# Patient Record
Sex: Male | Born: 1965 | Race: White | Hispanic: No | Smoking: Never smoker
Health system: Southern US, Community
[De-identification: ages and names within clinical notes are randomized; demographics above are authoritative.]

## PROBLEM LIST (undated history)

## (undated) DIAGNOSIS — I10 Essential (primary) hypertension: Secondary | ICD-10-CM

## (undated) DIAGNOSIS — F329 Major depressive disorder, single episode, unspecified: Secondary | ICD-10-CM

## (undated) DIAGNOSIS — F32A Depression, unspecified: Secondary | ICD-10-CM

## (undated) DIAGNOSIS — Z87442 Personal history of urinary calculi: Secondary | ICD-10-CM

## (undated) DIAGNOSIS — F419 Anxiety disorder, unspecified: Secondary | ICD-10-CM

## (undated) DIAGNOSIS — M1990 Unspecified osteoarthritis, unspecified site: Secondary | ICD-10-CM

## (undated) DIAGNOSIS — N200 Calculus of kidney: Secondary | ICD-10-CM

## (undated) HISTORY — DX: Essential (primary) hypertension: I10

## (undated) HISTORY — PX: ANKLE SURGERY: SHX546

## (undated) HISTORY — PX: FRACTURE SURGERY: SHX138

## (undated) HISTORY — DX: Calculus of kidney: N20.0

## (undated) HISTORY — PX: KIDNEY STONE SURGERY: SHX686

---
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
## 1998-11-14 ENCOUNTER — Emergency Department (HOSPITAL_COMMUNITY): Admission: EM | Admit: 1998-11-14 | Discharge: 1998-11-14 | Payer: Self-pay | Admitting: Emergency Medicine

## 1998-11-14 ENCOUNTER — Encounter: Payer: Self-pay | Admitting: Emergency Medicine

## 1998-11-18 ENCOUNTER — Ambulatory Visit (HOSPITAL_COMMUNITY): Admission: RE | Admit: 1998-11-18 | Discharge: 1998-11-18 | Payer: Self-pay | Admitting: Urology

## 1998-11-18 ENCOUNTER — Encounter: Payer: Self-pay | Admitting: Urology

## 1998-11-19 ENCOUNTER — Encounter: Payer: Self-pay | Admitting: Urology

## 1998-11-19 ENCOUNTER — Ambulatory Visit (HOSPITAL_COMMUNITY): Admission: RE | Admit: 1998-11-19 | Discharge: 1998-11-19 | Payer: Self-pay | Admitting: Urology

## 1999-07-23 ENCOUNTER — Encounter: Payer: Self-pay | Admitting: *Deleted

## 1999-07-23 ENCOUNTER — Encounter: Admission: RE | Admit: 1999-07-23 | Discharge: 1999-07-23 | Payer: Self-pay | Admitting: *Deleted

## 2002-10-18 ENCOUNTER — Ambulatory Visit (HOSPITAL_BASED_OUTPATIENT_CLINIC_OR_DEPARTMENT_OTHER): Admission: RE | Admit: 2002-10-18 | Discharge: 2002-10-18 | Payer: Self-pay | Admitting: Urology

## 2002-10-21 ENCOUNTER — Ambulatory Visit (HOSPITAL_BASED_OUTPATIENT_CLINIC_OR_DEPARTMENT_OTHER): Admission: RE | Admit: 2002-10-21 | Discharge: 2002-10-21 | Payer: Self-pay | Admitting: Urology

## 2002-10-21 ENCOUNTER — Encounter: Payer: Self-pay | Admitting: Urology

## 2002-12-30 ENCOUNTER — Ambulatory Visit (HOSPITAL_BASED_OUTPATIENT_CLINIC_OR_DEPARTMENT_OTHER): Admission: RE | Admit: 2002-12-30 | Discharge: 2002-12-30 | Payer: Self-pay | Admitting: Urology

## 2002-12-30 ENCOUNTER — Encounter: Payer: Self-pay | Admitting: Urology

## 2003-04-15 ENCOUNTER — Other Ambulatory Visit: Admission: RE | Admit: 2003-04-15 | Discharge: 2003-04-15 | Payer: Self-pay | Admitting: Family Medicine

## 2010-12-18 ENCOUNTER — Other Ambulatory Visit: Payer: Self-pay | Admitting: Family Medicine

## 2010-12-20 NOTE — Telephone Encounter (Signed)
Patient never seen here. Cannot find that patient saw you at [facility] either.

## 2013-05-17 ENCOUNTER — Encounter: Payer: Self-pay | Admitting: Podiatrist

## 2013-05-17 ENCOUNTER — Ambulatory Visit (INDEPENDENT_AMBULATORY_CARE_PROVIDER_SITE_OTHER): Payer: BC Managed Care – PPO | Admitting: Podiatrist

## 2013-05-17 VITALS — BP 142/75 | HR 67 | Resp 18

## 2013-05-17 DIAGNOSIS — G5781 Other specified mononeuropathies of right lower limb: Secondary | ICD-10-CM

## 2013-05-17 DIAGNOSIS — G5782 Other specified mononeuropathies of left lower limb: Secondary | ICD-10-CM

## 2013-05-17 DIAGNOSIS — G5760 Lesion of plantar nerve, unspecified lower limb: Secondary | ICD-10-CM

## 2013-05-17 NOTE — Patient Instructions (Signed)
Your 2nd interspace was injected today.  See if it feels better this time than it did after the last injection.  Try the pads in your shoes to take pressure off of the forefoot.  You may discontinue use if it is uncomfortable

## 2013-05-17 NOTE — Progress Notes (Signed)
°  Subjective:    Patient ID: Michael Mccoy, male    DOB: 03/13/1966, 47 y.o.   MRN: [id number]  HPI Patient present for neuroma pain right greater than left.  Patient has been receiving alcohol sclerosing injections.  Review of Systems unchanged from initial visit     Objective:   Physical Exam  Neurovascular status unchanged.  Pain with palpable click at the 2nd interspace of the right greater than left foot.  We tried an injection at the 3rd interspace at the last visit and it offered minimal relief.  Patient feels the previous injections were more beneficial which were at the 2nd interspace.          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  Will return in 2 weeks for injection #4.  strappings with met pads also fabricated to see if this offers offloading and relief  Marlowe Aschoff, DPM

## 2013-05-17 NOTE — Progress Notes (Deleted)
°  Subjective:    Patient ID: Michael Mccoy, male    DOB: [birth year], 47 y.o.   MRN: [id number]  HPI    Review of Systems  Constitutional: Negative.   HENT: Negative.   Eyes: Negative.   Respiratory: Negative.   Cardiovascular: Negative.   Gastrointestinal: Negative.   Endocrine: Negative.   Genitourinary: Negative.   Musculoskeletal: Negative.   Skin: Negative.   Allergic/Immunologic: Negative.   Neurological: Negative.   Hematological: Negative.   Psychiatric/Behavioral: Negative.        Objective:   Physical Exam        Assessment & Plan:

## 2013-05-31 ENCOUNTER — Encounter: Payer: Self-pay | Admitting: Podiatrist

## 2013-05-31 ENCOUNTER — Ambulatory Visit (INDEPENDENT_AMBULATORY_CARE_PROVIDER_SITE_OTHER): Payer: BC Managed Care – PPO | Admitting: Podiatrist

## 2013-05-31 VITALS — BP 155/99 | HR 49 | Resp 16 | Ht 72.0 in | Wt 180.0 lb

## 2013-05-31 DIAGNOSIS — G5760 Lesion of plantar nerve, unspecified lower limb: Secondary | ICD-10-CM

## 2013-05-31 DIAGNOSIS — G5781 Other specified mononeuropathies of right lower limb: Secondary | ICD-10-CM

## 2013-05-31 DIAGNOSIS — G5782 Other specified mononeuropathies of left lower limb: Secondary | ICD-10-CM

## 2013-05-31 DIAGNOSIS — B351 Tinea unguium: Secondary | ICD-10-CM

## 2013-05-31 NOTE — Progress Notes (Signed)
HPI  Patient present for forefoot pain right greater than left. Patient has been receiving alcohol sclerosing injections in the 2nd interspace and relates the left foot is ok now but the right is continuing to give him problems.  He states the pain now is below the 3rd metatarsal head with direct pressure.  As well as diffusely sub 2,3,4 right.   Objective:   Physical Exam Neurovascular status unchanged. Pain in several locations of the right foot are noted per the patient-  Pain submet 3 right, pain submet 2 and 4 , 2nd interspace as well.  No pinpoint area of discomfort identified. Clinically.  Palpable click still noted 2nd interspace right however has remained painful despite injection therapy Assessment & Plan:   A/ neuroma 2nd interspace right versus capsulitis 2nd or 3rd or 4th mpj  P/ I recommended he get a 2nd opinion by Dr. Charlsie Merles as alcohol Injections were helping but now are not per the patient.   Michael Mccoy, DPM

## 2013-06-06 ENCOUNTER — Ambulatory Visit (INDEPENDENT_AMBULATORY_CARE_PROVIDER_SITE_OTHER): Payer: BC Managed Care – PPO | Admitting: Podiatry

## 2013-06-06 ENCOUNTER — Encounter: Payer: Self-pay | Admitting: Podiatry

## 2013-06-06 VITALS — BP 154/94 | HR 53 | Resp 16

## 2013-06-06 DIAGNOSIS — M779 Enthesopathy, unspecified: Secondary | ICD-10-CM

## 2013-06-06 DIAGNOSIS — G5760 Lesion of plantar nerve, unspecified lower limb: Secondary | ICD-10-CM

## 2013-06-06 MED ORDER — TRIAMCINOLONE ACETONIDE 10 MG/ML IJ SUSP
5.0000 mg | Freq: Once | INTRAMUSCULAR | Status: AC
  Administered 2013-06-06: 5 mg via INTRA_ARTICULAR

## 2013-06-06 NOTE — Progress Notes (Signed)
Subjective:     Patient ID: Michael Mccoy, male   DOB: 11-27-1965, 47 y.o.   MRN: [id number]  HPI patient presents stating my right foot is still hurting. Has had for alcohol injections in the second interspace without relief of the pain he experiences. States the pain is worse when he is on his foot standing or if he does a lot of walking   Review of Systems  All other systems reviewed and are negative.       Objective:   Physical Exam  Nursing note and vitals reviewed. Constitutional: He is oriented to person, place, and time.  Cardiovascular: Intact distal pulses.   Musculoskeletal: Normal range of motion.  Neurological: He is oriented to person, place, and time.  Skin: Skin is warm.   patient is found to have discomfort that is mostly located in the third metatarsophalangeal joint right with mild discomfort in the adjacent interspace     Assessment:     Probable capsulitis of the third MPJ versus neuroma symptomatology    Plan:     Explained to condition and reviewed his x-rays today I did a proximal nerve block I then aspirated the joint and was able to get out small amount of clear fluid followed by application into the joint up a half cc of Kenalog dexamethasone combination with thick padding applied plantarly reappoint to recheck in 2 week

## 2013-06-14 ENCOUNTER — Ambulatory Visit: Payer: Self-pay | Admitting: Podiatrist

## 2013-06-20 ENCOUNTER — Ambulatory Visit: Payer: BC Managed Care – PPO | Admitting: Podiatry

## 2013-08-08 DIAGNOSIS — R4589 Other symptoms and signs involving emotional state: Secondary | ICD-10-CM

## 2013-08-08 HISTORY — DX: Other symptoms and signs involving emotional state: R45.89

## 2013-12-20 ENCOUNTER — Emergency Department (HOSPITAL_BASED_OUTPATIENT_CLINIC_OR_DEPARTMENT_OTHER)
Admission: EM | Admit: 2013-12-20 | Discharge: 2013-12-20 | Disposition: A | Discharge status: Home / Self Care | Payer: BC Managed Care – PPO | Attending: Emergency Medicine | Admitting: Emergency Medicine

## 2013-12-20 ENCOUNTER — Emergency Department (HOSPITAL_BASED_OUTPATIENT_CLINIC_OR_DEPARTMENT_OTHER): Payer: BC Managed Care – PPO

## 2013-12-20 DIAGNOSIS — R55 Syncope and collapse: Secondary | ICD-10-CM

## 2013-12-20 DIAGNOSIS — R0789 Other chest pain: Secondary | ICD-10-CM | POA: Insufficient documentation

## 2013-12-20 DIAGNOSIS — R5383 Other fatigue: Secondary | ICD-10-CM

## 2013-12-20 DIAGNOSIS — R5381 Other malaise: Secondary | ICD-10-CM | POA: Insufficient documentation

## 2013-12-20 DIAGNOSIS — IMO0002 Reserved for concepts with insufficient information to code with codable children: Secondary | ICD-10-CM | POA: Insufficient documentation

## 2013-12-20 DIAGNOSIS — Z87442 Personal history of urinary calculi: Secondary | ICD-10-CM | POA: Insufficient documentation

## 2013-12-20 DIAGNOSIS — R059 Cough, unspecified: Secondary | ICD-10-CM | POA: Insufficient documentation

## 2013-12-20 DIAGNOSIS — I10 Essential (primary) hypertension: Secondary | ICD-10-CM | POA: Insufficient documentation

## 2013-12-20 DIAGNOSIS — R42 Dizziness and giddiness: Secondary | ICD-10-CM | POA: Insufficient documentation

## 2013-12-20 DIAGNOSIS — R05 Cough: Secondary | ICD-10-CM | POA: Insufficient documentation

## 2013-12-20 DIAGNOSIS — R51 Headache: Secondary | ICD-10-CM | POA: Insufficient documentation

## 2013-12-20 DIAGNOSIS — Z79899 Other long term (current) drug therapy: Secondary | ICD-10-CM | POA: Insufficient documentation

## 2013-12-20 LAB — BASIC METABOLIC PANEL
BUN: 18 mg/dL (ref 6–23)
CALCIUM: 9.6 mg/dL (ref 8.4–10.5)
CHLORIDE: 102 meq/L (ref 96–112)
CO2: 24 meq/L (ref 19–32)
CREATININE: 1 mg/dL (ref 0.50–1.35)
GFR calc non Af Amer: 87 mL/min — ABNORMAL LOW (ref >90)
Glucose, Bld: 92 mg/dL (ref 70–99)
Potassium: 3.5 mEq/L — ABNORMAL LOW (ref 3.7–5.3)
SODIUM: 141 meq/L (ref 137–147)

## 2013-12-20 LAB — CBC WITH DIFFERENTIAL/PLATELET
BASOS ABS: 0 10*3/uL (ref 0.0–0.1)
BASOS PCT: 0 % (ref 0–1)
Eosinophils Absolute: 0.1 10*3/uL (ref 0.0–0.7)
Eosinophils Relative: 1 % (ref 0–5)
HCT: 40 % (ref 39.0–52.0)
Hemoglobin: 14 g/dL (ref 13.0–17.0)
LYMPHS PCT: 24 % (ref 12–46)
Lymphs Abs: 1.8 10*3/uL (ref 0.7–4.0)
MCH: 31.5 pg (ref 26.0–34.0)
MCHC: 35 g/dL (ref 30.0–36.0)
MCV: 90.1 fL (ref 78.0–100.0)
MONO ABS: 0.6 10*3/uL (ref 0.1–1.0)
Monocytes Relative: 8 % (ref 3–12)
NEUTROS ABS: 5 10*3/uL (ref 1.7–7.7)
Neutrophils Relative %: 67 % (ref 43–77)
PLATELETS: 212 10*3/uL (ref 150–400)
RBC: 4.44 MIL/uL (ref 4.22–5.81)
RDW: 13.4 % (ref 11.5–15.5)
WBC: 7.4 10*3/uL (ref 4.0–10.5)

## 2013-12-20 LAB — URINALYSIS, ROUTINE W REFLEX MICROSCOPIC
BILIRUBIN URINE: NEGATIVE
Glucose, UA: NEGATIVE mg/dL
Hgb urine dipstick: NEGATIVE
KETONES UR: NEGATIVE mg/dL
Leukocytes, UA: NEGATIVE
Nitrite: NEGATIVE
PROTEIN: NEGATIVE mg/dL
Specific Gravity, Urine: 1.021 (ref 1.005–1.030)
UROBILINOGEN UA: 1 mg/dL (ref 0.0–1.0)
pH: 6 (ref 5.0–8.0)

## 2013-12-20 LAB — TROPONIN I: Troponin I: <0.3 ng/mL (ref <0.30)

## 2013-12-20 MED ORDER — SODIUM CHLORIDE 0.9 % IV BOLUS (SEPSIS)
1000.0000 mL | Freq: Once | INTRAVENOUS | Status: AC
  Administered 2013-12-20: 1000 mL via INTRAVENOUS

## 2013-12-20 NOTE — ED Provider Notes (Signed)
CSN: [id number]     Arrival date & time 12/20/13  1803 History  This chart was scribed for Michael Mccoy, * by Blanchard KelchNicole Curnes, ED Scribe. The patient was seen in room MH08/MH08. Patient's care was started at 6:33 PM.     Chief Complaint  Patient presents with  . Blurred Vision     Patient is a 48 y.o. male presenting with eye problem. The history is provided by the patient. No language interpreter was used.  Eye Problem Location:  Both Quality: blurred. Onset quality:  Sudden Duration:  1 day Timing:  Intermittent Chronicity:  New Associated symptoms: headaches   Associated symptoms: no nausea, no numbness and no vomiting     HPI Comments: Michael Mccoy is a 48 y.o. male who presents to the Emergency Department complaining of intermittent, sudden-onset blurred vision that began this morning. He states that he was at his work standing when he experienced the blurred vision with concurrent dizziness and a syncopal sensation that lasted a few seconds. He describes the blurriness as generalized without being able to see his hand in front of his face.  He states that he is unsure if he lost consciousness with that episode or was "just out of it." He reports that he has had two episodes today that also lasted a few seconds while he was standing. He denies that he had suddenly stood up prior to the episodes coming on. He denies vision changes currently. He reports recent chest tightness, an intermittent headache that he describes as mild with brief episodes of sharp, intense pain and increased anxiety/stress from work. His wife states he has been agitated for the past month and has had severe coughing episodes in the mornings. He denies association with the coughing episodes and the blurred vision. He denies numbness, tingling, focal weakness, fever, abdominal pain, nausea, vomiting, diarrhea, constipation, melena or appetite change. He has a past medical history of hypertension and  kidney stones.   Past Medical History  Diagnosis Date  . Hypertension   . Kidney stones    No past surgical history on file. No family history on file. History  Substance Use Topics  . Smoking status: Never Smoker   . Smokeless tobacco: Never Used  . Alcohol Use: Yes    Review of Systems  Constitutional: Negative for fever and appetite change.  Eyes: Positive for visual disturbance.  Respiratory: Positive for cough and chest tightness.   Gastrointestinal: Negative for nausea, vomiting, abdominal pain and diarrhea.  Neurological: Positive for dizziness and headaches. Negative for numbness.  Psychiatric/Behavioral: Positive for agitation. The patient is nervous/anxious.   All other systems reviewed and are negative.     Allergies  Review of patient's allergies indicates no known allergies.  Home Medications   Prior to Admission medications   Medication Sig Start Date End Date Taking? Authorizing Provider  losartan (COZAAR) 100 MG tablet  05/14/13   Historical Provider, MD  NIFEdipine (PROCARDIA XL/ADALAT-CC) 90 MG 24 hr tablet  05/07/13   Historical Provider, MD   Triage Vitals: BP 136/97  Pulse 60  Temp(Src) 97.9 F (36.6 C) (Oral)  Resp 18  Ht 6\' 1"  (1.854 m)  Wt 185 lb (83.915 kg)  BMI 24.41 kg/m2  SpO2 100%  Physical Exam  Nursing note and vitals reviewed. Constitutional: He is oriented to person, place, and time. He appears well-developed and well-nourished. No distress.  HENT:  Head: Normocephalic and atraumatic.  Mouth/Throat: Oropharynx is clear and moist.  Eyes:  Conjunctivae are normal. Pupils are equal, round, and reactive to light. No scleral icterus.  Neck: Neck supple.  Cardiovascular: Normal rate, regular rhythm, normal heart sounds and intact distal pulses.   No murmur heard. Pulmonary/Chest: Effort normal and breath sounds normal. No stridor. No respiratory distress. He has no wheezes. He has no rales.  Abdominal: Soft. He exhibits no  distension. There is no tenderness.  Musculoskeletal: Normal range of motion. He exhibits no edema.  Neurological: He is alert and oriented to person, place, and time. He has normal strength. No cranial nerve deficit or sensory deficit. Coordination and gait normal. GCS eye subscore is 4. GCS verbal subscore is 5. GCS motor subscore is 6.  Skin: Skin is warm and dry. No rash noted.  Psychiatric: He has a normal mood and affect. His behavior is normal.    ED Course  Procedures (including critical care time)  DIAGNOSTIC STUDIES: Oxygen Saturation is 100% on room air, normal by my interpretation.    COORDINATION OF CARE: 6:44 PM -Will order BMP, CBC, Troponin I, UA and chest x-ray. Patient verbalizes understanding and agrees with treatment plan.    Labs Review Labs Reviewed  BASIC METABOLIC PANEL - Abnormal; Notable for the following:    Potassium 3.5 (*)    GFR calc non Af Amer 87 (*)    All other components within normal limits  CBC WITH DIFFERENTIAL  URINALYSIS, ROUTINE W REFLEX MICROSCOPIC  TROPONIN I    Imaging Review Dg Chest 2 View  12/20/2013   CLINICAL DATA:  Near syncope and chest tightness  EXAM: CHEST  2 VIEW  COMPARISON:  None.  FINDINGS: Lungs are clear. Heart size and pulmonary vascularity are normal. No adenopathy. No bone lesions. No pneumothorax.  IMPRESSION: No edema or consolidation.   Electronically Signed   By: Bretta BangWilliam  Woodruff M.D.   On: 12/20/2013 19:44     EKG Interpretation   Date/Time:  Friday Dec 20 2013 18:16:23 EDT Ventricular Rate:  60 PR Interval:  150 QRS Duration: 90 QT Interval:  424 QTC Calculation: 424 R Axis:   52 Text Interpretation:  Normal sinus rhythm Normal ECG No significant change  was found Confirmed by [facility]OFFORD  MD, TREY ([id number]) on 12/22/2013 1:11:14 AM      MDM   Final diagnoses:  Near syncope    48 yo male with history sounds most consistent with near-syncope in the setting of stress and generalized fatigue. His ED  workup was reassuring. He felt better after IV fluids. Clinical picture inconsistent with ACS, PE, dissection, CVA. discharged with PCP followup and return precautions given.  I personally performed the services described in this documentation, which was scribed in my presence. The recorded information has been reviewed and is accurate.     Michael ChurnJohn David Margarine Grosshans III, MD 12/22/13 0111

## 2013-12-20 NOTE — ED Notes (Signed)
Pt reports chest tightness.  3 episodes of sudden onset HA, dizziness and feelings of passing out today.  Denies LOC.  Reports HA at this time.  Pt acting weak and lethargic.

## 2013-12-20 NOTE — Discharge Instructions (Signed)
Near-Syncope °Near-syncope (commonly known as near fainting) is sudden weakness, dizziness, or feeling like you might pass out. During an episode of near-syncope, you may also develop pale skin, have tunnel vision, or feel sick to your stomach (nauseous). Near-syncope may occur when getting up after sitting or while standing for a long time. It is caused by a sudden decrease in blood flow to the brain. This decrease can result from various causes or triggers, most of which are not serious. However, because near-syncope can sometimes be a sign of something serious, a medical evaluation is required. The specific cause is often not determined. °HOME CARE INSTRUCTIONS  °Monitor your condition for any changes. The following actions may help to alleviate any discomfort you are experiencing: °· Have someone stay with you until you feel stable. °· Lie down right away if you start feeling like you might faint. Breathe deeply and steadily. Wait until all the symptoms have passed. Most of these episodes last only a few minutes. You may feel tired for several hours.   °· Drink enough fluids to keep your urine clear or pale yellow.   °· If you are taking blood pressure or heart medicine, get up slowly when seated or lying down. Take several minutes to sit and then stand. This can reduce dizziness. °· Follow up with your health care provider as directed.  °SEEK IMMEDIATE MEDICAL CARE IF:  °· You have a severe headache.   °· You have unusual pain in the chest, abdomen, or back.   °· You are bleeding from the mouth or rectum, or you have black or tarry stool.   °· You have an irregular or very fast heartbeat.   °· You have repeated fainting or have seizure-like jerking during an episode.   °· You faint when sitting or lying down.   °· You have confusion.   °· You have difficulty walking.   °· You have severe weakness.   °· You have vision problems.   °MAKE SURE YOU:  °· Understand these instructions. °· Will watch your  condition. °· Will get help right away if you are not doing well or get worse. °Document Released: 07/25/2005 Document Revised: 03/27/2013 Document Reviewed: 12/28/2012 °ExitCare® Patient Information ©2014 [employer]. ° °

## 2013-12-20 NOTE — ED Notes (Signed)
MD at bedside discussing plan of care with patient

## 2015-12-10 DIAGNOSIS — J069 Acute upper respiratory infection, unspecified: Secondary | ICD-10-CM | POA: Diagnosis not present

## 2017-01-06 DIAGNOSIS — M25569 Pain in unspecified knee: Secondary | ICD-10-CM | POA: Diagnosis not present

## 2017-01-06 DIAGNOSIS — I10 Essential (primary) hypertension: Secondary | ICD-10-CM | POA: Diagnosis not present

## 2017-01-06 DIAGNOSIS — N529 Male erectile dysfunction, unspecified: Secondary | ICD-10-CM | POA: Diagnosis not present

## 2017-01-06 DIAGNOSIS — J329 Chronic sinusitis, unspecified: Secondary | ICD-10-CM | POA: Diagnosis not present

## 2017-05-24 DIAGNOSIS — J029 Acute pharyngitis, unspecified: Secondary | ICD-10-CM | POA: Diagnosis not present

## 2017-09-04 DIAGNOSIS — N341 Nonspecific urethritis: Secondary | ICD-10-CM | POA: Diagnosis not present

## 2017-09-04 DIAGNOSIS — R1084 Generalized abdominal pain: Secondary | ICD-10-CM | POA: Diagnosis not present

## 2018-02-23 DIAGNOSIS — E7800 Pure hypercholesterolemia, unspecified: Secondary | ICD-10-CM | POA: Diagnosis not present

## 2018-02-23 DIAGNOSIS — Z1159 Encounter for screening for other viral diseases: Secondary | ICD-10-CM | POA: Diagnosis not present

## 2018-02-23 DIAGNOSIS — Z0000 Encounter for general adult medical examination without abnormal findings: Secondary | ICD-10-CM | POA: Diagnosis not present

## 2018-02-23 DIAGNOSIS — I10 Essential (primary) hypertension: Secondary | ICD-10-CM | POA: Diagnosis not present

## 2018-02-23 DIAGNOSIS — Z125 Encounter for screening for malignant neoplasm of prostate: Secondary | ICD-10-CM | POA: Diagnosis not present

## 2018-02-27 DIAGNOSIS — M545 Low back pain: Secondary | ICD-10-CM | POA: Diagnosis not present

## 2018-02-27 DIAGNOSIS — G5761 Lesion of plantar nerve, right lower limb: Secondary | ICD-10-CM | POA: Diagnosis not present

## 2018-02-27 DIAGNOSIS — M5441 Lumbago with sciatica, right side: Secondary | ICD-10-CM | POA: Diagnosis not present

## 2018-03-20 DIAGNOSIS — G5761 Lesion of plantar nerve, right lower limb: Secondary | ICD-10-CM | POA: Diagnosis not present

## 2018-03-21 DIAGNOSIS — G5761 Lesion of plantar nerve, right lower limb: Secondary | ICD-10-CM | POA: Diagnosis not present

## 2018-03-26 DIAGNOSIS — E7800 Pure hypercholesterolemia, unspecified: Secondary | ICD-10-CM | POA: Diagnosis not present

## 2018-03-26 DIAGNOSIS — N529 Male erectile dysfunction, unspecified: Secondary | ICD-10-CM | POA: Diagnosis not present

## 2018-03-26 DIAGNOSIS — I10 Essential (primary) hypertension: Secondary | ICD-10-CM | POA: Diagnosis not present

## 2018-04-05 DIAGNOSIS — K5730 Diverticulosis of large intestine without perforation or abscess without bleeding: Secondary | ICD-10-CM | POA: Diagnosis not present

## 2018-04-05 DIAGNOSIS — Z1211 Encounter for screening for malignant neoplasm of colon: Secondary | ICD-10-CM | POA: Diagnosis not present

## 2018-06-03 DIAGNOSIS — S00512A Abrasion of oral cavity, initial encounter: Secondary | ICD-10-CM | POA: Diagnosis not present

## 2018-07-22 DIAGNOSIS — M25571 Pain in right ankle and joints of right foot: Secondary | ICD-10-CM | POA: Diagnosis not present

## 2018-08-08 HISTORY — PX: COLONOSCOPY: SHX174

## 2019-01-20 DIAGNOSIS — M549 Dorsalgia, unspecified: Secondary | ICD-10-CM | POA: Diagnosis not present

## 2019-01-20 DIAGNOSIS — R350 Frequency of micturition: Secondary | ICD-10-CM | POA: Diagnosis not present

## 2019-01-20 DIAGNOSIS — I10 Essential (primary) hypertension: Secondary | ICD-10-CM | POA: Diagnosis not present

## 2019-01-20 DIAGNOSIS — R109 Unspecified abdominal pain: Secondary | ICD-10-CM | POA: Diagnosis not present

## 2019-01-22 ENCOUNTER — Other Ambulatory Visit: Payer: Self-pay | Admitting: Urology

## 2019-01-22 DIAGNOSIS — R3915 Urgency of urination: Secondary | ICD-10-CM | POA: Diagnosis not present

## 2019-01-22 DIAGNOSIS — K5730 Diverticulosis of large intestine without perforation or abscess without bleeding: Secondary | ICD-10-CM | POA: Diagnosis not present

## 2019-01-22 DIAGNOSIS — Z125 Encounter for screening for malignant neoplasm of prostate: Secondary | ICD-10-CM | POA: Diagnosis not present

## 2019-01-22 DIAGNOSIS — N132 Hydronephrosis with renal and ureteral calculous obstruction: Secondary | ICD-10-CM | POA: Diagnosis not present

## 2019-01-22 DIAGNOSIS — I700 Atherosclerosis of aorta: Secondary | ICD-10-CM | POA: Diagnosis not present

## 2019-01-22 DIAGNOSIS — N202 Calculus of kidney with calculus of ureter: Secondary | ICD-10-CM | POA: Diagnosis not present

## 2019-01-22 DIAGNOSIS — N5201 Erectile dysfunction due to arterial insufficiency: Secondary | ICD-10-CM | POA: Diagnosis not present

## 2019-01-26 ENCOUNTER — Other Ambulatory Visit (HOSPITAL_COMMUNITY)
Admission: RE | Admit: 2019-01-26 | Discharge: 2019-01-26 | Disposition: A | Discharge status: Home / Self Care | Payer: BC Managed Care – PPO | Source: Ambulatory Visit | Attending: Urology | Admitting: Urology

## 2019-01-26 DIAGNOSIS — Z1159 Encounter for screening for other viral diseases: Secondary | ICD-10-CM | POA: Insufficient documentation

## 2019-01-27 LAB — SARS CORONAVIRUS 2 (TAT 6-24 HRS): SARS Coronavirus 2: NEGATIVE

## 2019-01-29 ENCOUNTER — Other Ambulatory Visit: Payer: Self-pay

## 2019-01-29 ENCOUNTER — Encounter (HOSPITAL_BASED_OUTPATIENT_CLINIC_OR_DEPARTMENT_OTHER): Payer: Self-pay

## 2019-01-29 MED ORDER — GENTAMICIN SULFATE 40 MG/ML IJ SOLN
5.0000 mg/kg | INTRAVENOUS | Status: AC
  Administered 2019-01-30: 420 mg via INTRAVENOUS
  Filled 2019-01-29 (×2): qty 10.5

## 2019-01-29 NOTE — Progress Notes (Signed)
SPOKE W/  Jorrell     SCREENING SYMPTOMS OF COVID 19:   COUGH--NO  RUNNY NOSE--- NO  SORE THROAT---NO  NASAL CONGESTION----NO  SNEEZING----NO  SHORTNESS OF BREATH---NO  DIFFICULTY BREATHING---NO  TEMP >100.0 -----NO  UNEXPLAINED BODY ACHES------NO  CHILLS -------- NO  HEADACHES ---------NO  LOSS OF SMELL/ TASTE --------NO    HAVE YOU OR ANY FAMILY MEMBER TRAVELLED PAST 14 DAYS OUT OF THE   COUNTY---NO STATE----NO COUNTRY----NO  HAVE YOU OR ANY FAMILY MEMBER BEEN EXPOSED TO ANYONE WITH COVID 19? NO    

## 2019-01-29 NOTE — Progress Notes (Signed)
Spoke with: Roselyn Reef NPO:  No food after midnight/Clear liquids until 7:00AM DOS Arrival time: 1100AM Labs: Istat 8, EKG (COVID 01/28/2019 in epic) AM medications:  Amlodipine Pre op orders: Yes Ride home: Everlean Patterson [phone]

## 2019-01-30 ENCOUNTER — Encounter (HOSPITAL_BASED_OUTPATIENT_CLINIC_OR_DEPARTMENT_OTHER)
Admission: RE | Disposition: A | Discharge status: Home / Self Care | Payer: Self-pay | Source: Home / Self Care | Attending: Urology

## 2019-01-30 ENCOUNTER — Encounter (HOSPITAL_BASED_OUTPATIENT_CLINIC_OR_DEPARTMENT_OTHER): Payer: Self-pay | Admitting: Emergency Medicine

## 2019-01-30 ENCOUNTER — Other Ambulatory Visit: Payer: Self-pay

## 2019-01-30 ENCOUNTER — Ambulatory Visit (HOSPITAL_BASED_OUTPATIENT_CLINIC_OR_DEPARTMENT_OTHER)
Admission: RE | Admit: 2019-01-30 | Discharge: 2019-01-30 | Disposition: A | Discharge status: Home / Self Care | Payer: BC Managed Care – PPO | Attending: Urology | Admitting: Urology

## 2019-01-30 ENCOUNTER — Ambulatory Visit (HOSPITAL_BASED_OUTPATIENT_CLINIC_OR_DEPARTMENT_OTHER): Payer: BC Managed Care – PPO | Admitting: Anesthesiology

## 2019-01-30 DIAGNOSIS — F419 Anxiety disorder, unspecified: Secondary | ICD-10-CM | POA: Insufficient documentation

## 2019-01-30 DIAGNOSIS — E785 Hyperlipidemia, unspecified: Secondary | ICD-10-CM | POA: Diagnosis not present

## 2019-01-30 DIAGNOSIS — M1990 Unspecified osteoarthritis, unspecified site: Secondary | ICD-10-CM | POA: Diagnosis not present

## 2019-01-30 DIAGNOSIS — F329 Major depressive disorder, single episode, unspecified: Secondary | ICD-10-CM | POA: Diagnosis not present

## 2019-01-30 DIAGNOSIS — Z87442 Personal history of urinary calculi: Secondary | ICD-10-CM | POA: Diagnosis not present

## 2019-01-30 DIAGNOSIS — N202 Calculus of kidney with calculus of ureter: Secondary | ICD-10-CM | POA: Insufficient documentation

## 2019-01-30 DIAGNOSIS — I10 Essential (primary) hypertension: Secondary | ICD-10-CM | POA: Diagnosis not present

## 2019-01-30 HISTORY — DX: Personal history of urinary calculi: Z87.442

## 2019-01-30 HISTORY — PX: HOLMIUM LASER APPLICATION: SHX5852

## 2019-01-30 HISTORY — DX: Anxiety disorder, unspecified: F41.9

## 2019-01-30 HISTORY — DX: Unspecified osteoarthritis, unspecified site: M19.90

## 2019-01-30 HISTORY — DX: Depression, unspecified: F32.A

## 2019-01-30 HISTORY — PX: CYSTOSCOPY WITH RETROGRADE PYELOGRAM, URETEROSCOPY AND STENT PLACEMENT: SHX5789

## 2019-01-30 LAB — POCT I-STAT, CHEM 8
BUN: 24 mg/dL — ABNORMAL HIGH (ref 6–20)
Calcium, Ion: 1.26 mmol/L (ref 1.15–1.40)
Chloride: 100 mmol/L (ref 98–111)
Creatinine, Ser: 1.2 mg/dL (ref 0.61–1.24)
Glucose, Bld: 99 mg/dL (ref 70–99)
HCT: 42 % (ref 39.0–52.0)
Hemoglobin: 14.3 g/dL (ref 13.0–17.0)
Potassium: 3.7 mmol/L (ref 3.5–5.1)
Sodium: 138 mmol/L (ref 135–145)
TCO2: 30 mmol/L (ref 22–32)

## 2019-01-30 SURGERY — CYSTOURETEROSCOPY, WITH RETROGRADE PYELOGRAM AND STENT INSERTION
Anesthesia: General | Site: Ureter | Laterality: Right

## 2019-01-30 MED ORDER — FENTANYL CITRATE (PF) 100 MCG/2ML IJ SOLN
INTRAMUSCULAR | Status: AC
  Filled 2019-01-30: qty 2

## 2019-01-30 MED ORDER — SODIUM CHLORIDE 0.9 % IR SOLN
Status: DC | PRN
  Administered 2019-01-30: 6000 mL via INTRAVESICAL

## 2019-01-30 MED ORDER — MIDAZOLAM HCL 2 MG/2ML IJ SOLN
INTRAMUSCULAR | Status: DC | PRN
  Administered 2019-01-30: 2 mg via INTRAVENOUS

## 2019-01-30 MED ORDER — FENTANYL CITRATE (PF) 100 MCG/2ML IJ SOLN
25.0000 ug | INTRAMUSCULAR | Status: DC | PRN
  Filled 2019-01-30: qty 1

## 2019-01-30 MED ORDER — KETOROLAC TROMETHAMINE 30 MG/ML IJ SOLN
INTRAMUSCULAR | Status: AC
  Filled 2019-01-30: qty 1

## 2019-01-30 MED ORDER — KETOROLAC TROMETHAMINE 10 MG PO TABS: 10.0000 mg | ORAL_TABLET | Freq: Four times a day (QID) | ORAL | 0 refills | Status: DC | PRN

## 2019-01-30 MED ORDER — DEXAMETHASONE SODIUM PHOSPHATE 10 MG/ML IJ SOLN
INTRAMUSCULAR | Status: AC
  Filled 2019-01-30: qty 1

## 2019-01-30 MED ORDER — CEPHALEXIN 500 MG PO CAPS: 500.0000 mg | ORAL_CAPSULE | Freq: Two times a day (BID) | ORAL | 0 refills | Status: DC

## 2019-01-30 MED ORDER — DEXAMETHASONE SODIUM PHOSPHATE 10 MG/ML IJ SOLN
INTRAMUSCULAR | Status: DC | PRN
  Administered 2019-01-30: 10 mg via INTRAVENOUS

## 2019-01-30 MED ORDER — GLYCOPYRROLATE PF 0.2 MG/ML IJ SOSY
PREFILLED_SYRINGE | INTRAMUSCULAR | Status: AC
  Filled 2019-01-30: qty 1

## 2019-01-30 MED ORDER — ONDANSETRON HCL 4 MG/2ML IJ SOLN
INTRAMUSCULAR | Status: AC
  Filled 2019-01-30: qty 2

## 2019-01-30 MED ORDER — PROPOFOL 10 MG/ML IV BOLUS
INTRAVENOUS | Status: DC | PRN
  Administered 2019-01-30: 200 mg via INTRAVENOUS

## 2019-01-30 MED ORDER — LACTATED RINGERS IV SOLN
INTRAVENOUS | Status: DC
  Administered 2019-01-30: 12:00:00 via INTRAVENOUS
  Filled 2019-01-30: qty 1000

## 2019-01-30 MED ORDER — GLYCOPYRROLATE PF 0.2 MG/ML IJ SOSY
PREFILLED_SYRINGE | INTRAMUSCULAR | Status: DC | PRN
  Administered 2019-01-30: .2 mg via INTRAVENOUS

## 2019-01-30 MED ORDER — MIDAZOLAM HCL 2 MG/2ML IJ SOLN
INTRAMUSCULAR | Status: AC
  Filled 2019-01-30: qty 2

## 2019-01-30 MED ORDER — LIDOCAINE 2% (20 MG/ML) 5 ML SYRINGE
INTRAMUSCULAR | Status: DC | PRN
  Administered 2019-01-30: 60 mg via INTRAVENOUS

## 2019-01-30 MED ORDER — LIDOCAINE 2% (20 MG/ML) 5 ML SYRINGE
INTRAMUSCULAR | Status: AC
  Filled 2019-01-30: qty 5

## 2019-01-30 MED ORDER — KETOROLAC TROMETHAMINE 30 MG/ML IJ SOLN
INTRAMUSCULAR | Status: DC | PRN
  Administered 2019-01-30: 30 mg via INTRAVENOUS

## 2019-01-30 MED ORDER — SENNOSIDES-DOCUSATE SODIUM 8.6-50 MG PO TABS: 1.0000 | ORAL_TABLET | Freq: Two times a day (BID) | ORAL | 0 refills | Status: DC

## 2019-01-30 MED ORDER — OXYCODONE-ACETAMINOPHEN 5-325 MG PO TABS: 1.0000 | ORAL_TABLET | Freq: Four times a day (QID) | ORAL | 0 refills | Status: AC | PRN

## 2019-01-30 MED ORDER — IOHEXOL 300 MG/ML  SOLN
INTRAMUSCULAR | Status: DC | PRN
  Administered 2019-01-30: 14:00:00 10 mL via URETHRAL

## 2019-01-30 MED ORDER — PROPOFOL 10 MG/ML IV BOLUS
INTRAVENOUS | Status: AC
  Filled 2019-01-30: qty 20

## 2019-01-30 MED ORDER — ONDANSETRON HCL 4 MG/2ML IJ SOLN
4.0000 mg | Freq: Once | INTRAMUSCULAR | Status: AC | PRN
  Administered 2019-01-30: 15:00:00 4 mg via INTRAVENOUS
  Filled 2019-01-30: qty 2

## 2019-01-30 MED ORDER — FENTANYL CITRATE (PF) 100 MCG/2ML IJ SOLN
INTRAMUSCULAR | Status: DC | PRN
  Administered 2019-01-30 (×2): 25 ug via INTRAVENOUS
  Administered 2019-01-30: 50 ug via INTRAVENOUS

## 2019-01-30 SURGICAL SUPPLY — 25 items
BAG DRAIN URO-CYSTO SKYTR STRL (DRAIN) ×2 IMPLANT
BAG DRN UROCATH (DRAIN) ×1
BASKET LASER NITINOL 1.9FR (BASKET) ×1 IMPLANT
BSKT STON RTRVL 120 1.9FR (BASKET) ×1
CATH INTERMIT  6FR 70CM (CATHETERS) ×1 IMPLANT
CLOTH BEACON ORANGE TIMEOUT ST (SAFETY) ×2 IMPLANT
FIBER LASER FLEXIVA 365 (UROLOGICAL SUPPLIES) IMPLANT
FIBER LASER TRAC TIP (UROLOGICAL SUPPLIES) ×1 IMPLANT
GLOVE BIO SURGEON STRL SZ7.5 (GLOVE) ×2 IMPLANT
GOWN STRL REUS W/TWL LRG LVL3 (GOWN DISPOSABLE) ×2 IMPLANT
GUIDEWIRE ANG ZIPWIRE 038X150 (WIRE) ×2 IMPLANT
GUIDEWIRE STR DUAL SENSOR (WIRE) ×2 IMPLANT
IV NS 1000ML (IV SOLUTION) ×2
IV NS 1000ML BAXH (IV SOLUTION) ×1 IMPLANT
IV NS IRRIG 3000ML ARTHROMATIC (IV SOLUTION) ×2 IMPLANT
KIT TURNOVER CYSTO (KITS) ×2 IMPLANT
MANIFOLD NEPTUNE II (INSTRUMENTS) ×2 IMPLANT
NS IRRIG 500ML POUR BTL (IV SOLUTION) ×4 IMPLANT
PACK CYSTO (CUSTOM PROCEDURE TRAY) ×2 IMPLANT
SHEATH URETERAL 12FRX35CM (MISCELLANEOUS) ×1 IMPLANT
STENT POLARIS 5FRX26 (STENTS) ×1 IMPLANT
SYR 10ML LL (SYRINGE) ×2 IMPLANT
TUBE CONNECTING 12X1/4 (SUCTIONS) ×1 IMPLANT
TUBE FEEDING 8FR 16IN STR KANG (MISCELLANEOUS) ×1 IMPLANT
TUBING UROLOGY SET (TUBING) ×1 IMPLANT

## 2019-01-30 NOTE — Brief Op Note (Signed)
01/30/2019  2:31 PM  PATIENT:  Michael Mccoy  53 y.o. male  PRE-OPERATIVE DIAGNOSIS:  RIGHT URETERAL / RENAL STONES  POST-OPERATIVE DIAGNOSIS:  RIGHT URETERAL / RENAL STONES  PROCEDURE:  Procedure(s) with comments: CYSTOSCOPY WITH RETROGRADE PYELOGRAM, URETEROSCOPY AND STENT PLACEMENT (Right) - 90 MINS HOLMIUM LASER APPLICATION (Right)  SURGEON:  Surgeon(s) and Role:    Alexis Frock, MD - Primary  PHYSICIAN ASSISTANT:   ASSISTANTS: none   ANESTHESIA:   general  EBL:  minimal   BLOOD ADMINISTERED:none  DRAINS: none   LOCAL MEDICATIONS USED:  NONE  SPECIMEN:  Source of Specimen:  Rt renal / ureteral stone fragments  DISPOSITION OF SPECIMEN:  [facility] for compositional analysis  COUNTS:  YES  TOURNIQUET:  * No tourniquets in log *  DICTATION: .Other Dictation: Dictation Number [id number]  PLAN OF CARE: Discharge to home after PACU  PATIENT DISPOSITION:  PACU - hemodynamically stable.   Delay start of Pharmacological VTE agent (>24hrs) due to surgical blood loss or risk of bleeding: yes

## 2019-01-30 NOTE — Discharge Instructions (Signed)
1 - You may have urinary urgency (bladder spasms) and bloody urine on / off with stent in place. This is normal.  2 - Remove tethered stent on Friday morning at home by pulling on string, then blue-white plastic tubing, and discarding. Office is open Friday if and problems arise.   3 -  Call MD or go to ER for fever >102, severe pain / nausea / vomiting not relieved by medications, or acute change in medical status   Post Anesthesia Home Care Instructions  Activity: Get plenty of rest for the remainder of the day. A responsible individual must stay with you for 24 hours following the procedure.  For the next 24 hours, DO NOT: -Drive a car -Paediatric nurse -Drink alcoholic beverages -Take any medication unless instructed by your physician -Make any legal decisions or sign important papers.  Meals: Start with liquid foods such as gelatin or soup. Progress to regular foods as tolerated. Avoid greasy, spicy, heavy foods. If nausea and/or vomiting occur, drink only clear liquids until the nausea and/or vomiting subsides. Call your physician if vomiting continues.  Special Instructions/Symptoms: Your throat may feel dry or sore from the anesthesia or the breathing tube placed in your throat during surgery. If this causes discomfort, gargle with warm salt water. The discomfort should disappear within 24 hours.

## 2019-01-30 NOTE — Anesthesia Preprocedure Evaluation (Signed)
Anesthesia Evaluation  Patient identified by MRN, date of birth, ID band Patient awake    Reviewed: Allergy & Precautions, NPO status , Patient's Chart, lab work & pertinent test results  Airway Mallampati: II  TM Distance: >3 FB Neck ROM: Full    Dental  (+) Dental Advisory Given   Pulmonary neg pulmonary ROS,    breath sounds clear to auscultation       Cardiovascular hypertension, Pt. on medications  Rhythm:Regular Rate:Normal     Neuro/Psych negative neurological ROS     GI/Hepatic negative GI ROS, Neg liver ROS,   Endo/Other  negative endocrine ROS  Renal/GU Renal InsufficiencyRenal disease     Musculoskeletal  (+) Arthritis ,   Abdominal   Peds  Hematology negative hematology ROS (+)   Anesthesia Other Findings   Reproductive/Obstetrics                             Anesthesia Physical Anesthesia Plan  ASA: II  Anesthesia Plan: General   Post-op Pain Management:    Induction: Intravenous  PONV Risk Score and Plan: 2 and Dexamethasone, Ondansetron and Treatment may vary due to age or medical condition  Airway Management Planned: LMA  Additional Equipment:   Intra-op Plan:   Post-operative Plan: Extubation in OR  Informed Consent: I have reviewed the patients History and Physical, chart, labs and discussed the procedure including the risks, benefits and alternatives for the proposed anesthesia with the patient or authorized representative who has indicated his/her understanding and acceptance.     Dental advisory given  Plan Discussed with: CRNA  Anesthesia Plan Comments:         Anesthesia Quick Evaluation

## 2019-01-30 NOTE — Transfer of Care (Signed)
Immediate Anesthesia Transfer of Care Note  Patient: Michael Mccoy  Procedure(s) Performed: Procedure(s) (LRB): CYSTOSCOPY WITH RETROGRADE PYELOGRAM, URETEROSCOPY AND STENT PLACEMENT (Right) HOLMIUM LASER APPLICATION (Right)  Patient Location: PACU  Anesthesia Type: General  Level of Consciousness: awake, oriented, sedated and patient cooperative  Airway & Oxygen Therapy: Patient Spontanous Breathing and Patient connected to face mask oxygen  Post-op Assessment: Report given to PACU RN and Post -op Vital signs reviewed and stable  Post vital signs: Reviewed and stable  Complications: No apparent anesthesia complications  Last Vitals:  Vitals Value Taken Time  BP    Temp    Pulse 78 01/30/19 1444  Resp 16 01/30/19 1444  SpO2 100 % 01/30/19 1444  Vitals shown include unvalidated device data.  Last Pain:  Vitals:   01/30/19 1053  TempSrc: Oral      Patients Stated Pain Goal: 3 (01/30/19 1053)

## 2019-01-30 NOTE — Progress Notes (Signed)
Dr. Jillyn Hidden aware of BP 189/101- 177/106 and pt admits not taking his meds the last two days. OK to go home and take meds.

## 2019-01-30 NOTE — H&P (Signed)
Urology Admission H&P  Chief Complaint: Pre op Right Ureteroscopic Stone Manipulation  History of Present Illness:   1 - Recurrent Urolithiasis -  Pre 2020 - SWLx2, URS x 1  01/2019 - CT Rt prox 2mm stone (SSSD 14cm, 1400HU) and RUP 8mm pap tip / left side stone free.    PMH sig for orthos urgery, HLD, HTN. NO ischemic CV disease / blood thinners.. His PCP is Michael Guthrie MD with [facility]. 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   Today " Michael Mccoy " is seen to proceed with RIGHT ureteroscopic stone manipulation. Most recent UA without infectious parameters. NO interval fevers.      Past Medical History:  Diagnosis Date  . Anxiety   . Arthritis   . Depression   . History of kidney stones   . Hypertension   . Suicidal behavior 2015   Past Surgical History:  Procedure Laterality Date  . ANKLE SURGERY Left   . COLONOSCOPY  08/2018  . KIDNEY STONE SURGERY     Several    Home Medications:  Current Facility-Administered Medications  Medication Dose Route Frequency Provider Last Rate Last Dose  . gentamicin (GARAMYCIN) 420 mg in dextrose 5 % 100 mL IVPB  5 mg/kg Intravenous 30 min Pre-Op Michael Frock, MD      . lactated ringers infusion   Intravenous Continuous Michael Soman, MD 50 mL/hr at 01/30/19 1134     Allergies: No Known Allergies  History reviewed. No pertinent family history. Social History:  reports that he has never smoked. He has never used smokeless tobacco. He reports current alcohol use. He reports current drug use. Drug: Marijuana.  ROS  Physical Exam:  Vital signs in last 24 hours: Temp:  [98 F (36.7 C)] 98 F (36.7 C) (06/24 1053) Pulse Rate:  [55] 55 (06/24 1053) Resp:  [16] 16 (06/24 1053) BP: (174)/(98) 174/98 (06/24 1053) SpO2:  [99 %] 99 % (06/24 1053) Weight:  [83.6 kg] 83.6 kg (06/24 1053) Physical Exam  Laboratory Data:  Results for orders placed or performed during the  hospital encounter of 01/30/19 (from the past 24 hour(s))  I-STAT, chem 8     Status: Abnormal   Collection Time: 01/30/19 11:16 AM  Result Value Ref Range   Sodium 138 135 - 145 mmol/L   Potassium 3.7 3.5 - 5.1 mmol/L   Chloride 100 98 - 111 mmol/L   BUN 24 (H) 6 - 20 mg/dL   Creatinine, Ser 1.20 0.61 - 1.24 mg/dL   Glucose, Bld 99 70 - 99 mg/dL   Calcium, Ion 1.26 1.15 - 1.40 mmol/L   TCO2 30 22 - 32 mmol/L   Hemoglobin 14.3 13.0 - 17.0 g/dL   HCT 42.0 39.0 - 52.0 %   Recent Results (from the past 240 hour(s))  SARS Coronavirus 2 (Performed in [place] hospital lab)     Status: None   Collection Time: 01/26/19  4:00 PM   Specimen: Nasal Swab  Result Value Ref Range Status   SARS Coronavirus 2 NEGATIVE NEGATIVE Final    Comment: (NOTE) SARS-CoV-2 target nucleic acids are NOT DETECTED. The SARS-CoV-2 RNA is generally detectable in upper and lower respiratory specimens during the acute phase of infection. Negative results do not preclude SARS-CoV-2 infection, do not rule out co-infections with other pathogens, and should not be used as the sole basis for treatment or other patient management decisions. Negative results must be combined with  clinical observations, patient history, and epidemiological information. The expected result is Negative. Fact Sheet for Patients: FlowerCheck.behttps://www.fda.gov/media/136695/download Fact Sheet for Healthcare Providers: https://www.smith-hall.com/https://www.fda.gov/media/136692/download This test is not yet approved or cleared by the [place] FDA and  has been authorized for detection and/or diagnosis of SARS-CoV-2 by FDA under an Emergency Use Authorization (EUA). This EUA will remain  in effect (meaning this test can be used) for the duration of the COVID-19 declaration under Section 56 4(b)(1) of the Act, 21 U.S.C. section 360bbb-3(b)(1), unless the authorization is terminated or revoked sooner. Performed at [facility], [address] [place],  [place] [id number]    Creatinine: Recent Labs    01/30/19 1116  CREATININE 1.20     Plan:   Proceed as planned with RIGHT Ureteroscopic Stone manipulation with goal of Rt side stone free. Risks, benefits, alternatives, expected peri-op course discussed previously and reiterated today.   Michael Mccoy 01/30/2019, 12:02 PM

## 2019-01-30 NOTE — Anesthesia Procedure Notes (Signed)
Procedure Name: LMA Insertion Date/Time: 01/30/2019 1:18 PM Performed by: Suan Halter, CRNA Pre-anesthesia Checklist: Patient identified, Emergency Drugs available, Suction available and Patient being monitored Patient Re-evaluated:Patient Re-evaluated prior to induction Oxygen Delivery Method: Circle system utilized Preoxygenation: Pre-oxygenation with 100% oxygen Induction Type: IV induction Ventilation: Mask ventilation without difficulty LMA: LMA inserted LMA Size: 4.0 Number of attempts: 1 Airway Equipment and Method: Bite block Placement Confirmation: positive ETCO2 Tube secured with: Tape Dental Injury: Teeth and Oropharynx as per pre-operative assessment

## 2019-01-31 ENCOUNTER — Encounter (HOSPITAL_BASED_OUTPATIENT_CLINIC_OR_DEPARTMENT_OTHER): Payer: Self-pay | Admitting: Urology

## 2019-01-31 DIAGNOSIS — N202 Calculus of kidney with calculus of ureter: Secondary | ICD-10-CM | POA: Diagnosis not present

## 2019-01-31 NOTE — Op Note (Signed)
NAME: Michael Mccoy, Michael Mccoy MEDICAL RECORD [id number] ACCOUNT [id number] DATE OF BIRTH:05-Oct-1965 FACILITY: [facility] LOCATION: [facility] PHYSICIAN:Leng Montesdeoca, MD  OPERATIVE REPORT  DATE OF PROCEDURE:  01/30/2019  PREOPERATIVE DIAGNOSIS:  Right ureteral and renal stones.  PROCEDURE: 1.  Cystoscopy, right retrograde pyelogram, interpretation. 2.  Right ureteroscopy with laser lithotripsy. 3.  Right ureteral stent 5 x 26 Polaris with tether.  SURGEON:  Alexis Frock, MD  ESTIMATED BLOOD LOSS:  Nil.  COMPLICATIONS:  None.  SPECIMENS:  Right renal and ureteral stones for analysis.  FINDINGS: 1.  Right proximal ureteral stone, right upper mid pole renal stone. 2.  Complete resolution of all accessible stone fragments larger than one-third mm following laser lithotripsy and basket extraction. 3.  Successful placement of right ureteral stent proximal in the renal pelvis, distal end urinary bladder.  INDICATIONS:  The patient is a pleasant 53 year old man who was found on workup for colicky flank pain to have a recurrent large right proximal ureteral stone at around 13 mm or so.  Office discussion of management including shock lithotripsy versus  uteroscopy versus medical therapy, and given the multifocality and size, it was agreed upon ureteroscopy with goal of right side stone free.  Informed consent was obtained and placed in medical record.  DESCRIPTION OF PROCEDURE:  The patient was identified.  The procedure being right ureteroscopic stent placement confirmed.  Procedure timeout was performed.  Intravenous antibiotics administered.  General LMA anesthesia induced.  The patient was placed  into a low lithotomy position, sterile field was created prepped and draped base of the penis and perineum and proximal thighs using iodine x3 and cystourethroscopy was performed with a 21-French rigid cystoscope with offset lens.  Inspection of anterior  and posterior urethra were unremarkable.   Inspection of the urinary bladder revealed no diverticula, calcifications, papillary lesions.  Ureteral orifices were singleton bilaterally.  The right ureteral orifice was cannulated with a 6-French renal  catheter and right retrograde pyelogram was obtained.  Right retrograde pyelogram demonstrated a single right ureter with single system right kidney.  There was a calcification turned filling defect in the proximal right ureter consistent with known stone.  A 0.038 ZIPwire was then advanced to the level to  the lower pole and set aside as a safety wire.  An 8-French feeding tube was placed in the urinary bladder pressure released and semirigid ureteroscopy was performed distal 4/5 of the right ureter alongside a separate sensor working wire.  No mucosal  abnormalities were found.  At the very upper reaches of the semirigid scope, the lower portion of the impacted stone was visualized.  This was not conducive to laser lithotripsy with the scope.  Therefore, it was exchanged for a 12/14 medium length  ureteral access sheath to the level of the proximal ureter using continuous fluoroscopic guidance, taking exquisite care not to pass the sheath proximal to the area of the visualized stone and flexible digital ureteroscopy was then performed of the  proximal right ureter.  This revealed a large somewhat impacted proximal stone as anticipated and holmium laser energy applied at a setting of 0.2 joules and 20 Hz and using a dusting technique, approximately 70% of the stone was ablated.  The remaining  25% was fragmented and fragments removed with an escape basket and set aside for analysis.  Repeat ureteroscopy of the whole right kidney, including all calices x3, did reveal an upper mid pole calix stone as anticipated.  It also appeared to be too  large for  simple basketing and holmium laser energy applied to the stone, fragmenting it into approximately 3 smaller pieces that were then removed.  Following this,  excellent hemostasis.  No evidence of renal perforation.  There was complete resolution  of all accessible stone fragments larger than one-third mm.  The access sheath was removed under continuous vision.  There is only mild to moderate mucosal edema at the site of prior stone impaction in the right ureter proximally.  It was felt that  interval stenting with tethered stent be warranted.  As such, a new 5 x 26 Polaris-type stent was placed remaining safety wire using fluoroscopic guidance.  Good proximal and distal plane were noted.  Tether was left in place and fashioned to the dorsum  of the penis and the procedure was terminated.  The patient tolerated the procedure well.  No immediate perioperative complications.  The patient did postanesthesia care in stable condition with plan for discharge home.  TN/NUANCE  D:01/30/2019 T:01/30/2019 JOB:[id number]

## 2019-01-31 NOTE — Anesthesia Postprocedure Evaluation (Signed)
Anesthesia Post Note  Patient: Michael Mccoy  Procedure(s) Performed: CYSTOSCOPY WITH RETROGRADE PYELOGRAM, URETEROSCOPY AND STENT PLACEMENT (Right Ureter) HOLMIUM LASER APPLICATION (Right Ureter)     Patient location during evaluation: PACU Anesthesia Type: General Level of consciousness: awake and alert Pain management: pain level controlled Vital Signs Assessment: post-procedure vital signs reviewed and stable Respiratory status: spontaneous breathing, nonlabored ventilation, respiratory function stable and patient connected to nasal cannula oxygen Cardiovascular status: blood pressure returned to baseline and stable Postop Assessment: no apparent nausea or vomiting Anesthetic complications: no    Last Vitals:  Vitals:   01/30/19 1515 01/30/19 1600  BP: (!) 169/99 (!) 187/91  Pulse: 62 (!) 52  Resp: 15 12  Temp:  37.1 C  SpO2: 98% 97%    Last Pain:  Vitals:   01/30/19 1600  TempSrc:   PainSc: 0-No pain                 Tiajuana Amass

## 2019-06-04 DIAGNOSIS — N3281 Overactive bladder: Secondary | ICD-10-CM | POA: Diagnosis not present

## 2019-06-04 DIAGNOSIS — Z125 Encounter for screening for malignant neoplasm of prostate: Secondary | ICD-10-CM | POA: Diagnosis not present

## 2019-06-04 DIAGNOSIS — Z0000 Encounter for general adult medical examination without abnormal findings: Secondary | ICD-10-CM | POA: Diagnosis not present

## 2019-06-04 DIAGNOSIS — E7800 Pure hypercholesterolemia, unspecified: Secondary | ICD-10-CM | POA: Diagnosis not present

## 2019-06-04 DIAGNOSIS — Z23 Encounter for immunization: Secondary | ICD-10-CM | POA: Diagnosis not present

## 2019-08-19 DIAGNOSIS — R110 Nausea: Secondary | ICD-10-CM | POA: Diagnosis not present

## 2019-08-19 DIAGNOSIS — Z20822 Contact with and (suspected) exposure to covid-19: Secondary | ICD-10-CM | POA: Diagnosis not present

## 2019-08-19 DIAGNOSIS — R509 Fever, unspecified: Secondary | ICD-10-CM | POA: Diagnosis not present

## 2020-05-25 DIAGNOSIS — S0181XA Laceration without foreign body of other part of head, initial encounter: Secondary | ICD-10-CM | POA: Diagnosis not present

## 2020-06-12 DIAGNOSIS — I10 Essential (primary) hypertension: Secondary | ICD-10-CM | POA: Diagnosis not present

## 2020-06-12 DIAGNOSIS — N200 Calculus of kidney: Secondary | ICD-10-CM | POA: Diagnosis not present

## 2020-06-12 DIAGNOSIS — Z0000 Encounter for general adult medical examination without abnormal findings: Secondary | ICD-10-CM | POA: Diagnosis not present

## 2020-06-12 DIAGNOSIS — Z23 Encounter for immunization: Secondary | ICD-10-CM | POA: Diagnosis not present

## 2020-06-12 DIAGNOSIS — Z125 Encounter for screening for malignant neoplasm of prostate: Secondary | ICD-10-CM | POA: Diagnosis not present

## 2020-06-12 DIAGNOSIS — E7800 Pure hypercholesterolemia, unspecified: Secondary | ICD-10-CM | POA: Diagnosis not present

## 2020-07-07 ENCOUNTER — Other Ambulatory Visit: Payer: Self-pay

## 2020-07-07 ENCOUNTER — Other Ambulatory Visit: Payer: BC Managed Care – PPO

## 2020-07-07 DIAGNOSIS — Z20822 Contact with and (suspected) exposure to covid-19: Secondary | ICD-10-CM

## 2020-07-09 LAB — NOVEL CORONAVIRUS, NAA: SARS-CoV-2, NAA: NOT DETECTED

## 2020-07-09 LAB — SPECIMEN STATUS REPORT

## 2020-07-09 LAB — SARS-COV-2, NAA 2 DAY TAT

## 2020-07-10 DIAGNOSIS — R062 Wheezing: Secondary | ICD-10-CM | POA: Diagnosis not present

## 2020-07-10 DIAGNOSIS — J029 Acute pharyngitis, unspecified: Secondary | ICD-10-CM | POA: Diagnosis not present

## 2020-07-10 DIAGNOSIS — I10 Essential (primary) hypertension: Secondary | ICD-10-CM | POA: Diagnosis not present

## 2020-08-27 DIAGNOSIS — M7711 Lateral epicondylitis, right elbow: Secondary | ICD-10-CM | POA: Diagnosis not present

## 2020-08-27 DIAGNOSIS — M67912 Unspecified disorder of synovium and tendon, left shoulder: Secondary | ICD-10-CM | POA: Diagnosis not present

## 2020-08-27 DIAGNOSIS — M25512 Pain in left shoulder: Secondary | ICD-10-CM | POA: Diagnosis not present

## 2020-08-27 DIAGNOSIS — M7702 Medial epicondylitis, left elbow: Secondary | ICD-10-CM | POA: Diagnosis not present

## 2020-09-09 DIAGNOSIS — Z03818 Encounter for observation for suspected exposure to other biological agents ruled out: Secondary | ICD-10-CM | POA: Diagnosis not present

## 2020-09-09 DIAGNOSIS — Z20822 Contact with and (suspected) exposure to covid-19: Secondary | ICD-10-CM | POA: Diagnosis not present

## 2021-03-10 DIAGNOSIS — Z20822 Contact with and (suspected) exposure to covid-19: Secondary | ICD-10-CM | POA: Diagnosis not present

## 2021-04-22 ENCOUNTER — Emergency Department (HOSPITAL_COMMUNITY)
Admission: EM | Admit: 2021-04-22 | Discharge: 2021-04-22 | Disposition: A | Discharge status: Home / Self Care | Payer: No Typology Code available for payment source | Attending: Emergency Medicine | Admitting: Emergency Medicine

## 2021-04-22 ENCOUNTER — Emergency Department (HOSPITAL_COMMUNITY): Payer: No Typology Code available for payment source

## 2021-04-22 ENCOUNTER — Other Ambulatory Visit: Payer: Self-pay

## 2021-04-22 DIAGNOSIS — I10 Essential (primary) hypertension: Secondary | ICD-10-CM | POA: Insufficient documentation

## 2021-04-22 DIAGNOSIS — Y990 Civilian activity done for income or pay: Secondary | ICD-10-CM | POA: Insufficient documentation

## 2021-04-22 DIAGNOSIS — S39012A Strain of muscle, fascia and tendon of lower back, initial encounter: Secondary | ICD-10-CM | POA: Insufficient documentation

## 2021-04-22 DIAGNOSIS — W19XXXA Unspecified fall, initial encounter: Secondary | ICD-10-CM | POA: Insufficient documentation

## 2021-04-22 DIAGNOSIS — Z79899 Other long term (current) drug therapy: Secondary | ICD-10-CM | POA: Insufficient documentation

## 2021-04-22 DIAGNOSIS — T63441A Toxic effect of venom of bees, accidental (unintentional), initial encounter: Secondary | ICD-10-CM | POA: Diagnosis not present

## 2021-04-22 DIAGNOSIS — M25531 Pain in right wrist: Secondary | ICD-10-CM | POA: Insufficient documentation

## 2021-04-22 DIAGNOSIS — S34109A Unspecified injury to unspecified level of lumbar spinal cord, initial encounter: Secondary | ICD-10-CM | POA: Diagnosis present

## 2021-04-22 MED ORDER — DIAZEPAM 2 MG PO TABS
2.0000 mg | ORAL_TABLET | Freq: Once | ORAL | Status: AC
  Administered 2021-04-22: 2 mg via ORAL
  Filled 2021-04-22: qty 1

## 2021-04-22 MED ORDER — CYCLOBENZAPRINE HCL 10 MG PO TABS: 10.0000 mg | ORAL_TABLET | Freq: Two times a day (BID) | ORAL | 0 refills | Status: DC | PRN

## 2021-04-22 MED ORDER — HYDROCODONE-ACETAMINOPHEN 5-325 MG PO TABS: 2.0000 | ORAL_TABLET | Freq: Four times a day (QID) | ORAL | 0 refills | Status: DC | PRN

## 2021-04-22 NOTE — ED Provider Notes (Signed)
[facility]-EMERGENCY DEPT Provider Note   CSN: [id number] Arrival date & time: 04/22/21  1250     History Chief Complaint  Patient presents with   Michael Mccoy is a 55 y.o. male who present today for evaluation of fall.  He was stung by two bees and while running he fell on   He took three aleve about an hour ago.  He does not take any blood thinners.  He denies any allergic reaction to bees.  He denies any itching or swelling.  He feels like he was stung once on his face and once on his upper back however does not know where.  He denies concern for retained stinger.  He states that he did not strike his head or pass out.  No pain in his head or in his neck.  He has left-sided lateral low back pain with feelings of spasms.  He also reports pain in the base of his right thumb.  He is right-hand dominant.  He denies any rashes or itching.  No shortness of breath.  No chest pain or syncope.  HPI     Past Medical History:  Diagnosis Date   Anxiety    Arthritis    Depression    History of kidney stones    Hypertension    Suicidal behavior 2015    There are no problems to display for this patient.   Past Surgical History:  Procedure Laterality Date   ANKLE SURGERY Left    COLONOSCOPY  08/2018   CYSTOSCOPY WITH RETROGRADE PYELOGRAM, URETEROSCOPY AND STENT PLACEMENT Right 01/30/2019   Procedure: CYSTOSCOPY WITH RETROGRADE PYELOGRAM, URETEROSCOPY AND STENT PLACEMENT;  Surgeon: Sebastian Ache, MD;  Location: [facility];  Service: Urology;  Laterality: Right;  90 MINS   HOLMIUM LASER APPLICATION Right 01/30/2019   Procedure: HOLMIUM LASER APPLICATION;  Surgeon: Sebastian Ache, MD;  Location: [facility];  Service: Urology;  Laterality: Right;   KIDNEY STONE SURGERY     Several       No family history on file.  Social History   Tobacco Use   Smoking status: Never   Smokeless tobacco: Never  Vaping Use    Vaping Use: Never used  Substance Use Topics   Alcohol use: Yes   Drug use: Yes    Types: Marijuana    Comment: occ. last time several weeks ago    Home Medications Prior to Admission medications   Medication Sig Start Date End Date Taking? Authorizing Provider  HYDROcodone-acetaminophen (NORCO/VICODIN) 5-325 MG tablet Take 2 tablets by mouth every 6 (six) hours as needed for severe pain. 04/22/21  Yes Cristina Gong, PA-C  amLODipine (NORVASC) 10 MG tablet Take 10 mg by mouth every morning.    [provider]  cephALEXin (KEFLEX) 500 MG capsule Take 1 capsule (500 mg total) by mouth 2 (two) times daily. X 3 days to prevent infection with tethered stent in place. 01/30/19   Sebastian Ache, MD  cyclobenzaprine (FLEXERIL) 10 MG tablet Take 1 tablet (10 mg total) by mouth 2 (two) times daily as needed for muscle spasms. 04/22/21   Cristina Gong, PA-C  ketorolac (TORADOL) 10 MG tablet Take 1 tablet (10 mg total) by mouth every 6 (six) hours as needed for moderate pain. Or stent discomfort post-operatively 01/30/19   Sebastian Ache, MD  losartan (COZAAR) 100 MG tablet every morning.  05/14/13   [provider]  senna-docusate (SENOKOT-S) 8.6-50  MG tablet Take 1 tablet by mouth 2 (two) times daily. While taking strongest pain meds to prevent constipation. 01/30/19   Sebastian Ache, MD    Allergies    Patient has no known allergies.  Review of Systems   Review of Systems  Constitutional:  Negative for chills and fever.  Respiratory:  Negative for chest tightness and shortness of breath.   Gastrointestinal:  Negative for abdominal pain, nausea and vomiting.  Musculoskeletal:  Positive for back pain. Negative for neck pain.       Right wrist pain  Skin:  Negative for rash.  Neurological:  Negative for weakness and headaches.  All other systems reviewed and are negative.  Physical Exam Updated Vital Signs BP (!) 182/104 (BP Location: Right Arm)   Pulse (!) 55    Temp 98.2 F (36.8 C) (Oral)   Resp 16   SpO2 99%   Physical Exam Vitals and nursing note reviewed.  Constitutional:      Appearance: He is not ill-appearing.     Comments: Appears uncomfortable  HENT:     Head: Normocephalic and atraumatic.  Cardiovascular:     Rate and Rhythm: Normal rate.     Pulses: Normal pulses.     Comments: Bilateral arms and legs are warm and well perfused Pulmonary:     Effort: Pulmonary effort is normal. No respiratory distress.  Abdominal:     Tenderness: There is no abdominal tenderness. There is no guarding.  Musculoskeletal:     Cervical back: No rigidity.     Comments: Patient is mild upper midline L-spine tenderness to palpation without obvious step-offs or deformities. There is tenderness over the left lumbar back.  There is intermittent spasm type pain which makes patient appears very uncomfortable.  No weakness legs bilaterally.   There is tenderness over the base of the right thumb and pain with movement.  No tenderness over the anatomic snuffbox or in the right wrist.  Skin:    Findings: No rash.     Comments: Unable to identify site of sting  Neurological:     Mental Status: He is alert. Mental status is at baseline.     Comments: Awake and alert, answers all questions appropriately.  Speech is not slurred.    Psychiatric:        Mood and Affect: Mood normal.    ED Results / Procedures / Treatments   Labs (all labs ordered are listed, but only abnormal results are displayed) Labs Reviewed - No data to display  EKG None  Radiology DG Lumbar Spine Complete  Result Date: 04/22/2021 CLINICAL DATA:  Larey Seat at work EXAM: LUMBAR SPINE - COMPLETE 4+ VIEW COMPARISON:  01/20/2019 FINDINGS: Frontal, bilateral oblique, lateral views of the lumbar spine are obtained. There are 5 non-rib-bearing lumbar type vertebral bodies in anatomic alignment. No acute displaced fracture. Multilevel lumbar spondylosis and facet hypertrophy again noted and  unchanged. Sacroiliac joints are unremarkable. IMPRESSION: 1. Stable multilevel lumbar spondylosis and facet hypertrophy. No acute fracture. Electronically Signed   By: Sharlet Salina M.D.   On: 04/22/2021 15:37   DG Hand Complete Right  Result Date: 04/22/2021 CLINICAL DATA:  Larey Seat, pain EXAM: RIGHT HAND - COMPLETE 3+ VIEW COMPARISON:  None. FINDINGS: Frontal, oblique, and lateral views of the right hand are obtained. No fracture, subluxation, or dislocation. Joint space narrowing and osteophyte formation of the second distal interphalangeal joint consistent with osteoarthritis. Soft tissues are unremarkable. IMPRESSION: 1. No acute displaced fracture. Electronically Signed  By: Sharlet Salina M.D.   On: 04/22/2021 15:38    Procedures Procedures   Medications Ordered in ED Medications  diazepam (VALIUM) tablet 2 mg (2 mg Oral Given 04/22/21 1407)    ED Course  I have reviewed the triage vital signs and the nursing notes.  Pertinent labs & imaging results that were available during my care of the patient were reviewed by me and considered in my medical decision making (see chart for details).    MDM Rules/Calculators/A&P                          Patient is a 55 year old man who presents today for evaluation after a fall.  He was attacked by bees at work and 1 running away fell.  He reports pain in his right wrist and in his lower back.    He denies any allergic symptoms.  On exam he appears to be uncomfortable with back spasms.  He denies any red flag symptoms including no changes to bowel/bladder function.  No weakness.  He is able to ambulate however states it is painful to do so.  Given the degree of his pain he was treated in the emergency room with p.o. Valium which took the edge off of his pain and spasms. He does not have any abdominal pain, no history of AAA or similar.  Given that this started after a fall and is easily reproducible with any movement and he does not have pain  unless he is moving suspect musculoskeletal pain. Plain films of L-spine were obtained without fracture.  Given that his pain is primarily over the lateral aspect of the back I do not think he would require CT scan of the L-spine or further imaging. Right hand x-rays were obtained due to pain at the base of the thumb without fracture or other acute abnormality.  Recommended conservative care.  He did not strike his head or pass out, no pain in his head or neck, no indication for imaging of head or neck.  He is neurovascularly intact on exam.  Will give prescription for Flexeril for muscle spasms.  [facility] PMP is consulted and he is additionally given a prescription for short course of Vicodin.  We discussed safely using this, not to take both the Vicodin and Flexeril at the same time, and he states his understanding.  Work note is given.  While in the emergency room he was hypertensive.  I suspect that this is secondary to his pain.  Recommended PCP follow up for re-check.   Return precautions were discussed with patient who states their understanding.  At the time of discharge patient denied any unaddressed complaints or concerns.  Patient is agreeable for discharge home.  Note: Portions of this report may have been transcribed using voice recognition software. Every effort was made to ensure accuracy; however, inadvertent computerized transcription errors may be present   Final Clinical Impression(s) / ED Diagnoses Final diagnoses:  Strain of lumbar region, initial encounter  Fall, initial encounter  Right wrist pain  Bee sting, accidental or unintentional, initial encounter    Rx / DC Orders ED Discharge Orders          Ordered    cyclobenzaprine (FLEXERIL) 10 MG tablet  2 times daily PRN,   Status:  Discontinued        04/22/21 1619    cyclobenzaprine (FLEXERIL) 10 MG tablet  2 times daily PRN  04/22/21 1623    HYDROcodone-acetaminophen (NORCO/VICODIN) 5-325 MG  tablet  Every 6 hours PRN        04/22/21 1623             Norman Clay 04/22/21 2225    Lorre Nick, MD 04/24/21 1150

## 2021-04-22 NOTE — Discharge Instructions (Addendum)
Do not take both the norco and flexeril at the same time.  You should take one or the other.  The norco (hydrocodone-acetaminophen) has Tylenol in it and you need to consider this when you are taking the maximum safe dose of medicines.  Today you received medications that may make you sleepy or impair your ability to make decisions.  For the next 24 hours please do not drive, operate heavy machinery, care for a small child with out another adult present, or perform any activities that may cause harm to you or someone else if you were to fall asleep or be impaired.   You are being prescribed a medication which may make you sleepy. Please follow up of listed precautions for at least 24 hours after taking one dose.  Please take Ibuprofen (Advil, motrin) and Tylenol (acetaminophen) to relieve your pain.    You may take up to 600 MG (3 pills) of normal strength ibuprofen every 8 hours as needed.   You make take tylenol, up to 1,000 mg (two extra strength pills) every 8 hours as needed.   It is safe to take ibuprofen and tylenol at the same time as they work differently.   Do not take more than 3,000 mg tylenol in a 24 hour period (not more than one dose every 8 hours.  Please check all medication labels as many medications such as pain and cold medications may contain tylenol.  Do not drink alcohol while taking these medications.  Do not take other NSAID'S while taking ibuprofen (such as aleve or naproxen).  Please take ibuprofen with food to decrease stomach upset.  As we discussed I would recommend that you schedule a follow-up appointment with your primary care doctor.  While in the emergency room your blood pressure was elevated.  This is most likely due to pain and the stress of being in the emergency room.  Please follow-up with your primary care doctor to get this rechecked as if it remains elevated you will most likely need treatment for your high blood pressure or you may require changes in your  treatment.  If you develop any fevers, numbness in your legs or genitals, you have any changes to your ability to urinate or defecate, you have abdominal pain, or any new weakness or concerning symptoms please seek additional medical care and evaluation.

## 2021-04-22 NOTE — ED Triage Notes (Signed)
Pt presents with c/o fall that occurred at work today. 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 Pt c/o back pain and right wrist pain. Pt did not hit his head and experienced no LOC.

## 2021-06-22 DIAGNOSIS — Z23 Encounter for immunization: Secondary | ICD-10-CM | POA: Diagnosis not present

## 2021-06-22 DIAGNOSIS — Z0000 Encounter for general adult medical examination without abnormal findings: Secondary | ICD-10-CM | POA: Diagnosis not present

## 2021-06-22 DIAGNOSIS — N529 Male erectile dysfunction, unspecified: Secondary | ICD-10-CM | POA: Diagnosis not present

## 2021-06-22 DIAGNOSIS — Z125 Encounter for screening for malignant neoplasm of prostate: Secondary | ICD-10-CM | POA: Diagnosis not present

## 2021-06-22 DIAGNOSIS — I10 Essential (primary) hypertension: Secondary | ICD-10-CM | POA: Diagnosis not present

## 2021-06-24 ENCOUNTER — Other Ambulatory Visit: Payer: Self-pay | Admitting: Family Medicine

## 2021-06-24 DIAGNOSIS — I10 Essential (primary) hypertension: Secondary | ICD-10-CM

## 2021-09-07 ENCOUNTER — Encounter (HOSPITAL_BASED_OUTPATIENT_CLINIC_OR_DEPARTMENT_OTHER): Payer: Self-pay | Admitting: Cardiovascular Disease

## 2021-09-07 ENCOUNTER — Other Ambulatory Visit: Payer: Self-pay

## 2021-09-07 ENCOUNTER — Ambulatory Visit (HOSPITAL_BASED_OUTPATIENT_CLINIC_OR_DEPARTMENT_OTHER): Payer: BC Managed Care – PPO | Admitting: Cardiovascular Disease

## 2021-09-07 DIAGNOSIS — E7800 Pure hypercholesterolemia, unspecified: Secondary | ICD-10-CM

## 2021-09-07 DIAGNOSIS — R0789 Other chest pain: Secondary | ICD-10-CM | POA: Diagnosis not present

## 2021-09-07 DIAGNOSIS — I10 Essential (primary) hypertension: Secondary | ICD-10-CM

## 2021-09-07 DIAGNOSIS — Z006 Encounter for examination for normal comparison and control in clinical research program: Secondary | ICD-10-CM

## 2021-09-07 DIAGNOSIS — I1A0 Resistant hypertension: Secondary | ICD-10-CM

## 2021-09-07 HISTORY — DX: Essential (primary) hypertension: I10

## 2021-09-07 HISTORY — DX: Resistant hypertension: I1A.0

## 2021-09-07 HISTORY — DX: Other chest pain: R07.89

## 2021-09-07 HISTORY — DX: Pure hypercholesterolemia, unspecified: E78.00

## 2021-09-07 MED ORDER — HYDRALAZINE HCL 50 MG PO TABS: 50.0000 mg | ORAL_TABLET | Freq: Three times a day (TID) | ORAL | 3 refills | Status: DC

## 2021-09-07 MED ORDER — VALSARTAN 320 MG PO TABS: 320.0000 mg | ORAL_TABLET | Freq: Every day | ORAL | 3 refills | Status: DC

## 2021-09-07 NOTE — Assessment & Plan Note (Addendum)
Blood pressures are poorly controlled.  He has previously been uncontrolled when he was also on hydrochlorothiazide but did not tolerate this due to dehydration.  Cost is a concern as we consider different medication options.  We will switch losartan to valsartan 3 and 20 mg daily.  Continue amlodipine.  We will also add hydralazine 50 mg 3 times a day.  Avoid beta-blockers due to bradycardia.  Would consider adding doxazosin if his blood pressures remain poorly controlled given that he also has prostate issues and nocturia.  He will check his blood pressures twice a day and bring to follow-up.  He will hold the valsartan for 2 days and then get renin and and aldosterone levels checked.  We will check lipids and a CMP at that time as well.  He does struggle with stress and anxiety, which are likely contributing.  He gets good exercise at work and walks over 7 miles a day.  He is already started making out of good lifestyle changes.  He is interested in enrolling in our remote patient monitoring study and consents to monitoring in the [employer] remote patient monitoring system.

## 2021-09-07 NOTE — Assessment & Plan Note (Signed)
Lipids have been elevated in the past.  He will come back for fasting lipids and a CMP.

## 2021-09-07 NOTE — Research (Signed)
Subject Name: Michael Mccoy met inclusion and exclusion criteria for the Virtual Care and Social Determinant Interventions for the management of hypertension trial.  The informed consent form, study requirements and expectations were reviewed with the subject by Dr. Oval Linsey and myself. The subject was given the opportunity to read the consent and ask questions. The subject verbalized understanding of the trial requirements.  All questions were addressed prior to the signing of the consent form. The subject agreed to participate in the trial and signed the informed consent. The informed consent was obtained prior to performance of any protocol-specific procedures for the subject.  A copy of the signed informed consent was given to the subject and a copy was placed in the subject's medical record.  Michael Mccoy was randomized to Group 1.

## 2021-09-07 NOTE — Progress Notes (Signed)
[facility]pertension [facility] Initial Assessment:    Date:  09/07/2021   ID:  Michael Mccoy, DOB [birth year], MRN [id number]  PCP:  Ileana Ladd, MD  Cardiologist:  None  Nephrologist:  Referring MD: Ileana Ladd, MD   CC: Hypertension  History of Present Illness:    Michael Mccoy is a 56 y.o. male with a hx of hypertension, arthritis, nephrolithiasis, depression, and suicidal behavior, here to establish care in the Advanced [facility]pertension Clinic. He was in the ER 04/2021 for a fall. His blood pressure was 182/104. He was on amlodipine and losartan, and he was referred to [facility]. He saw Dr. Modesto Charon on 06/2021 and his BP was 163/92. He was referred for renal artery dopplers that have not been completed.  Today, he is doing well. He initially realized he had high blood pressure in 1991. At that time his BP had spiked to the 200s systolic and he passed out and fell onto the floor. At home he has a few BP cuffs, and he usually has similar readings to today's of 167/95. He notes it has always been this high even with other medications. Previously while he was taking beta blockers, his heart rate was found to be in the 40s when he donated blood. Occasionally he feels "a little" chest tightness, but he believes this may be due to his anxiety. He denies any chest tightness during exertion. While working he often is walking up to 7 miles a day (15-20 minute increments throughout the day). For work he is typically outside in the heat with limited areas to use the restroom. Therefore he notes he is prone to dehydration, which is why HCTZ was discontinued. He also reports he is prone to developing kidney stones. Previously he was drinking at least 30 oz cup of coffee daily. Since then he has cut this back to about 1.5 tablespoons of coffee grounds. He has sodas rarely, and alcohol consumption is limited to 1 beer or other drink per day. He has never been a smoker. He states his  diet is "horrible." He wants to start focus on limiting his carbohydrates. During the day he may have a few sandwiches, snack chips, or granola nut bars. Lately he is trying to be more active and reduce his stress, but this is difficult. He is not sure if he snores, and he generally feels well rested when he wakes up. After returning home and sitting on the couch he will fall asleep easily. He denies any palpitations, or shortness of breath. No lightheadedness, headaches, syncope, orthopnea, PND, lower extremity edema or exertional symptoms.  Previous antihypertensives: HCTZ -prone to dehydration working outside   Past Medical History:  Diagnosis Date   Anxiety    Arthritis    Chest pressure 09/07/2021   Depression    History of kidney stones    Hypertension    Pure hypercholesterolemia 09/07/2021   Resistant hypertension 09/07/2021   Suicidal behavior 2015    Past Surgical History:  Procedure Laterality Date   ANKLE SURGERY Left    COLONOSCOPY  08/2018   CYSTOSCOPY WITH RETROGRADE PYELOGRAM, URETEROSCOPY AND STENT PLACEMENT Right 01/30/2019   Procedure: CYSTOSCOPY WITH RETROGRADE PYELOGRAM, URETEROSCOPY AND STENT PLACEMENT;  Surgeon: Sebastian Ache, MD;  Location: [facility];  Service: Urology;  Laterality: Right;  90 MINS   HOLMIUM LASER APPLICATION Right 01/30/2019   Procedure: HOLMIUM LASER APPLICATION;  Surgeon: Sebastian Ache, MD;  Location: [facility];  Service: Urology;  Laterality: Right;   KIDNEY STONE SURGERY     Several    Current Medications: Current Meds  Medication Sig   amLODipine (NORVASC) 10 MG tablet Take 10 mg by mouth every morning.   hydrALAZINE (APRESOLINE) 50 MG tablet Take 1 tablet (50 mg total) by mouth 3 (three) times daily.   sildenafil (VIAGRA) 100 MG tablet Take 1 tablet by mouth as needed.   valsartan (DIOVAN) 320 MG tablet Take 1 tablet (320 mg total) by mouth daily.   [DISCONTINUED] losartan (COZAAR) 100 MG tablet  every morning.      Allergies:   Patient has no known allergies.   Social History   Socioeconomic History   Marital status: Divorced    Spouse name: Not on file   Number of children: Not on file   Years of education: Not on file   Highest education level: Not on file  Occupational History   Not on file  Tobacco Use   Smoking status: Never   Smokeless tobacco: Never  Vaping Use   Vaping Use: Never used  Substance and Sexual Activity   Alcohol use: Yes   Drug use: Yes    Types: Marijuana    Comment: occ. last time several weeks ago   Sexual activity: Not on file  Other Topics Concern   Not on file  Social History Narrative   Not on file   Social Determinants of Health   Financial Resource Strain: Low Risk    Difficulty of Paying Living Expenses: Not hard at all  Food Insecurity: No Food Insecurity   Worried About Programme researcher, broadcasting/film/videounning Out of Food in the Last Year: Never true   Ran Out of Food in the Last Year: Never true  Transportation Needs: No Transportation Needs   Lack of Transportation (Medical): No   Lack of Transportation (Non-Medical): No  Physical Activity: Unknown   Days of Exercise per Week: Not on file   Minutes of Exercise per Session: 0 min  Stress: Not on file  Social Connections: Not on file     Family History: The patient's family history includes Bradycardia in his father and mother; COPD in his father; Heart attack in his father.  ROS:   Please see the history of present illness.    (+) Chest tightness (+) Anxiety (+) Stress All other systems reviewed and are negative.  EKGs/Labs/Other Studies Reviewed:    No prior cardiovascular studies available.   EKG:   09/07/2021: EKG was not ordered.   Recent Labs: No results found for requested labs within last 8760 hours.   Recent Lipid Panel No results found for: CHOL, TRIG, HDL, CHOLHDL, VLDL, LDLCALC, LDLDIRECT  Physical Exam:    VS:  BP (!) 167/95 (BP Location: Right Arm, Patient Position:  Sitting, Cuff Size: Large)    Pulse 62    Ht 6' (1.829 m)    Wt 201 lb 9.6 oz (91.4 kg)    BMI 27.34 kg/m  , BMI Body mass index is 27.34 kg/m. GENERAL:  Well appearing HEENT: Pupils equal round and reactive, fundi not visualized, oral mucosa unremarkable NECK:  No jugular venous distention, waveform within normal limits, carotid upstroke brisk and symmetric, no bruits, no thyromegaly LUNGS:  Clear to auscultation bilaterally HEART:  RRR.  PMI not displaced or sustained,S1 and S2 within normal limits, no S3, no S4, no clicks, no rubs, no murmurs ABD:  Flat, positive bowel sounds normal in frequency in pitch, no bruits, no rebound, no guarding, no midline pulsatile  mass, no hepatomegaly, no splenomegaly EXT:  2 plus pulses throughout, no edema, no cyanosis no clubbing SKIN:  No rashes no nodules NEURO:  Cranial nerves II through XII grossly intact, motor grossly intact throughout PSYCH:  Cognitively intact, oriented to person place and time   ASSESSMENT/PLAN:    Resistant hypertension Blood pressures are poorly controlled.  He has previously been uncontrolled when he was also on hydrochlorothiazide but did not tolerate this due to dehydration.  Cost is a concern as we consider different medication options.  We will switch losartan to valsartan 3 and 20 mg daily.  Continue amlodipine.  We will also add hydralazine 50 mg 3 times a day.  Avoid beta-blockers due to bradycardia.  Would consider adding doxazosin if his blood pressures remain poorly controlled given that he also has prostate issues and nocturia.  He will check his blood pressures twice a day and bring to follow-up.  He will hold the valsartan for 2 days and then get renin and and aldosterone levels checked.  We will check lipids and a CMP at that time as well.  He does struggle with stress and anxiety, which are likely contributing.  He gets good exercise at work and walks over 7 miles a day.  He is already started making out of good  lifestyle changes.  He is interested in enrolling in our remote patient monitoring study and consents to monitoring in the [employer] remote patient monitoring system.  Pure hypercholesterolemia Lipids have been elevated in the past.  He will come back for fasting lipids and a CMP.  Chest pressure He reports chest pressure that occurs in the setting of stress and anxiety.  He never has exertional chest pain and is able to do a physical job without symptoms.  No ischemic evaluation at this time.   Screening for Secondary Hypertension:  Causes 09/07/2021  Drugs/Herbals Screened     - Comments limiting caffeine, no tobacco.  struggles with diet.  Renovascular HTN Not Screened     - Comments Renal artery Dopplers were appropriately ordered but he is unable to afford them at this time.  Sleep Apnea Screened     - Comments no sympotms  Thyroid Disease Screened     - Comments 1.1 on 06/2021  Hyperaldosteronism Screened     - Comments Check renal and and aldosterone  Pheochromocytoma Screened     - Comments No symptoms  Cushing's Syndrome Screened     - Comments No symptoms  Hyperparathyroidism Screened  Coarctation of the Aorta Screened     - Comments BP symmetric  Compliance Screened    Relevant Labs/Studies: Basic Labs Latest Ref Rng & Units 01/30/2019 12/20/2013  Sodium 135 - 145 mmol/L 138 141  Potassium 3.5 - 5.1 mmol/L 3.7 3.5(L)  Creatinine 0.61 - 1.24 mg/dL 1.611.20 0.961.00     Disposition:    FU with APP/PharmD in 1 month.   FU with Takeyla Million C. Duke Salviaandolph, MD, [facility] in 4 months.   Medication Adjustments/Labs and Tests Ordered: Current medicines are reviewed at length with the patient today.  Concerns regarding medicines are outlined above.   Orders Placed This Encounter  Procedures   Lipid panel   Aldosterone + renin activity w/ ratio   Comprehensive metabolic panel   Meds ordered this encounter  Medications   valsartan (DIOVAN) 320 MG tablet    Sig: Take 1 tablet (320 mg total)  by mouth daily.    Dispense:  90 tablet    Refill:  3   hydrALAZINE (APRESOLINE) 50 MG tablet    Sig: Take 1 tablet (50 mg total) by mouth 3 (three) times daily.    Dispense:  270 tablet    Refill:  3   I,Mathew Stumpf,acting as a scribe for [name], MD.,have documented all relevant documentation on the behalf of [name], MD,as directed by  [name], MD while in the presence of [name], MD.  I, Gabriel Conry C. Duke Salvia, MD have reviewed all documentation for this visit.  The documentation of the exam, diagnosis, procedures, and orders on 09/07/2021 are all accurate and complete.   Signed, [name], MD  09/07/2021 9:41 AM    [facility]

## 2021-09-07 NOTE — Patient Instructions (Signed)
Medication Instructions:  START HYDRALAZINE 50 MG THREE TIMES A DAY   START VALSARTAN 320 MG DAILY   STOP LOSARTAN    Labwork: HOLD YOUR VALSARTAN FOR 2 DAYS PRIOR TO LABS GO FASTING TO ANY [facility] OR UPSTAIRS [address]    Testing/Procedures: NONE   Follow-Up: 10/07/2021 AT 9:00 AM [facility]    You will receive a phone call from the PREP exercise and nutrition program to schedule an initial assessment.   Special Instructions:   MONITOR AND LOG BLOOD PRESSURE WITH MACHINE PROVIDED BRING YOUR READINGS TO FOLLOW UP IN 1 MONTH  DASH Eating Plan DASH stands for "Dietary Approaches to Stop Hypertension." The DASH eating plan is a healthy eating plan that has been shown to reduce high blood pressure (hypertension). It may also reduce your risk for type 2 diabetes, heart disease, and stroke. The DASH eating plan may also help with weight loss. What are tips for following this plan?  General guidelines Avoid eating more than 2,300 mg (milligrams) of salt (sodium) a day. If you have hypertension, you may need to reduce your sodium intake to 1,500 mg a day. Limit alcohol intake to no more than 1 drink a day for nonpregnant women and 2 drinks a day for men. One drink equals 12 oz of beer, 5 oz of wine, or 1 oz of hard liquor. Work with your health care provider to maintain a healthy body weight or to lose weight. Ask what an ideal weight is for you. Get at least 30 minutes of exercise that causes your heart to beat faster (aerobic exercise) most days of the week. Activities may include walking, swimming, or biking. Work with your health care provider or diet and nutrition specialist (dietitian) to adjust your eating plan to your individual calorie needs. Reading food labels  Check food labels for the amount of sodium per serving. Choose foods with less than 5 percent of the Daily Value of sodium. Generally, foods with less than 300 mg of sodium per serving fit into this  eating plan. To find whole grains, look for the word "whole" as the first word in the ingredient list. Shopping Buy products labeled as "low-sodium" or "no salt added." Buy fresh foods. Avoid canned foods and premade or frozen meals. Cooking Avoid adding salt when cooking. Use salt-free seasonings or herbs instead of table salt or sea salt. Check with your health care provider or pharmacist before using salt substitutes. Do not fry foods. Cook foods using healthy methods such as baking, boiling, grilling, and broiling instead. Cook with heart-healthy oils, such as olive, canola, soybean, or sunflower oil. Meal planning Eat a balanced diet that includes: 5 or more servings of fruits and vegetables each day. At each meal, try to fill half of your plate with fruits and vegetables. Up to 6-8 servings of whole grains each day. Less than 6 oz of lean meat, poultry, or fish each day. A 3-oz serving of meat is about the same size as a deck of cards. One egg equals 1 oz. 2 servings of low-fat dairy each day. A serving of nuts, seeds, or beans 5 times each week. Heart-healthy fats. Healthy fats called Omega-3 fatty acids are found in foods such as flaxseeds and coldwater fish, like sardines, salmon, and mackerel. Limit how much you eat of the following: Canned or prepackaged foods. Food that is high in trans fat, such as fried foods. Food that is high in saturated fat, such as fatty  meat. Sweets, desserts, sugary drinks, and other foods with added sugar. Full-fat dairy products. Do not salt foods before eating. Try to eat at least 2 vegetarian meals each week. Eat more home-cooked food and less restaurant, buffet, and fast food. When eating at a restaurant, ask that your food be prepared with less salt or no salt, if possible. What foods are recommended? The items listed may not be a complete list. Talk with your dietitian about what dietary choices are best for you. Grains Whole-grain or  whole-wheat bread. Whole-grain or whole-wheat pasta. Brown rice. Orpah Cobb. Bulgur. Whole-grain and low-sodium cereals. Pita bread. Low-fat, low-sodium crackers. Whole-wheat flour tortillas. Vegetables Fresh or frozen vegetables (raw, steamed, roasted, or grilled). Low-sodium or reduced-sodium tomato and vegetable juice. Low-sodium or reduced-sodium tomato sauce and tomato paste. Low-sodium or reduced-sodium canned vegetables. Fruits All fresh, dried, or frozen fruit. Canned fruit in natural juice (without added sugar). Meat and other protein foods Skinless chicken or [place]. Ground chicken or [place]. Pork with fat trimmed off. Fish and seafood. Egg whites. Dried beans, peas, or lentils. Unsalted nuts, nut butters, and seeds. Unsalted canned beans. Lean cuts of beef with fat trimmed off. Low-sodium, lean deli meat. Dairy Low-fat (1%) or fat-free (skim) milk. Fat-free, low-fat, or reduced-fat cheeses. Nonfat, low-sodium ricotta or cottage cheese. Low-fat or nonfat yogurt. Low-fat, low-sodium cheese. Fats and oils Soft margarine without trans fats. Vegetable oil. Low-fat, reduced-fat, or light mayonnaise and salad dressings (reduced-sodium). Canola, safflower, olive, soybean, and sunflower oils. Avocado. Seasoning and other foods Herbs. Spices. Seasoning mixes without salt. Unsalted popcorn and pretzels. Fat-free sweets. What foods are not recommended? The items listed may not be a complete list. Talk with your dietitian about what dietary choices are best for you. Grains Baked goods made with fat, such as croissants, muffins, or some breads. Dry pasta or rice meal packs. Vegetables Creamed or fried vegetables. Vegetables in a cheese sauce. Regular canned vegetables (not low-sodium or reduced-sodium). Regular canned tomato sauce and paste (not low-sodium or reduced-sodium). Regular tomato and vegetable juice (not low-sodium or reduced-sodium). Rosita Fire. Olives. Fruits Canned fruit in a light  or heavy syrup. Fried fruit. Fruit in cream or butter sauce. Meat and other protein foods Fatty cuts of meat. Ribs. Fried meat. Tomasa Blase. Sausage. Bologna and other processed lunch meats. Salami. Fatback. Hotdogs. Bratwurst. Salted nuts and seeds. Canned beans with added salt. Canned or smoked fish. Whole eggs or egg yolks. Chicken or [place] with skin. Dairy Whole or 2% milk, cream, and half-and-half. Whole or full-fat cream cheese. Whole-fat or sweetened yogurt. Full-fat cheese. Nondairy creamers. Whipped toppings. Processed cheese and cheese spreads. Fats and oils Butter. Stick margarine. Lard. Shortening. Ghee. Bacon fat. Tropical oils, such as coconut, palm kernel, or palm oil. Seasoning and other foods Salted popcorn and pretzels. Onion salt, garlic salt, seasoned salt, table salt, and sea salt. Worcestershire sauce. Tartar sauce. Barbecue sauce. Teriyaki sauce. Soy sauce, including reduced-sodium. Steak sauce. Canned and packaged gravies. Fish sauce. Oyster sauce. Cocktail sauce. Horseradish that you find on the shelf. Ketchup. Mustard. Meat flavorings and tenderizers. Bouillon cubes. Hot sauce and Tabasco sauce. Premade or packaged marinades. Premade or packaged taco seasonings. Relishes. Regular salad dressings. Where to find more information: National Heart, Lung, and Blood Institute: PopSteam.is American Heart Association: www.heart.org Summary The DASH eating plan is a healthy eating plan that has been shown to reduce high blood pressure (hypertension). It may also reduce your risk for type 2 diabetes, heart disease, and stroke. With the DASH  eating plan, you should limit salt (sodium) intake to 2,300 mg a day. If you have hypertension, you may need to reduce your sodium intake to 1,500 mg a day. When on the DASH eating plan, aim to eat more fresh fruits and vegetables, whole grains, lean proteins, low-fat dairy, and heart-healthy fats. Work with your health care provider or diet and  nutrition specialist (dietitian) to adjust your eating plan to your individual calorie needs. This information is not intended to replace advice given to you by your health care provider. Make sure you discuss any questions you have with your health care provider. Document Released: 07/14/2011 Document Revised: 07/07/2017 Document Reviewed: 07/18/2016 Elsevier Patient Education  2020 [employer].

## 2021-09-07 NOTE — Addendum Note (Signed)
Addended by: [name] C on: 09/07/2021 09:45 AM   Modules accepted: Orders

## 2021-09-07 NOTE — Assessment & Plan Note (Signed)
He reports chest pressure that occurs in the setting of stress and anxiety.  He never has exertional chest pain and is able to do a physical job without symptoms.  No ischemic evaluation at this time.

## 2021-09-11 ENCOUNTER — Telehealth: Payer: Self-pay | Admitting: Pharmacist

## 2021-09-11 NOTE — Telephone Encounter (Signed)
Spoke with patient.  Concerned that BP has not started to come down yet. Has been checking BP frequently and is typically in 160s-170s/100s-110s.  No adverse effects to new medications valsartan or hydralaZine. Checks BP at 6am and 6pm.  Advised that new medications may have not started working yet.  Since he was concerned, advised he can take another hydralazine 50mg  if systolic BP remains over 160

## 2021-09-13 NOTE — Telephone Encounter (Signed)
noted 

## 2021-10-07 ENCOUNTER — Ambulatory Visit: Payer: BC Managed Care – PPO | Admitting: Pharmacist

## 2021-10-07 ENCOUNTER — Encounter: Payer: Self-pay | Admitting: Pharmacist

## 2021-10-07 ENCOUNTER — Other Ambulatory Visit: Payer: Self-pay

## 2021-10-07 VITALS — BP 153/87 | HR 68 | Resp 15 | Ht 72.0 in | Wt 199.0 lb

## 2021-10-07 DIAGNOSIS — E7800 Pure hypercholesterolemia, unspecified: Secondary | ICD-10-CM | POA: Diagnosis not present

## 2021-10-07 DIAGNOSIS — I10 Essential (primary) hypertension: Secondary | ICD-10-CM | POA: Diagnosis not present

## 2021-10-07 DIAGNOSIS — I1A0 Resistant hypertension: Secondary | ICD-10-CM

## 2021-10-07 MED ORDER — DOXAZOSIN MESYLATE 2 MG PO TABS: 2.0000 mg | ORAL_TABLET | Freq: Every day | ORAL | 1 refills | Status: DC

## 2021-10-07 NOTE — Progress Notes (Signed)
Patient ID: Michael Mccoy                 DOB: [birth year]                      MRN: [id number] ? ? ? ? ?HPI: ?Michael Mccoy is a 56 y.o. male referred by Dr. Oval Linsey to HTN clinic. PMH is significant for HTN, BPH, and depression.  Seen by Ivar Drape on 09/07/21 for first Adv Htn visit.  Enrolled in Group [employer].   ? ?Patient presents today for first HTN visit.  Is concerned regarding high copay for this visit and that he needs 2 more subsequent visits before follow up with Dr Oval Linsey.   ? ?Is enrolled in [place] home monitoring.  Has checked BP sporadically throughout month of February.  Readings range from 158/92 (today) to 189/105 (2/12).  Pulse rate typically in mid 60s.   ? ?Currently managed on valsartan, hydralazine, and amlodipine.  Advised over the phone last month he could take extra hydralazine as needed for hypertension which he reports he has done occasionally. Not having any adverse effects to meds although sometimes forgets to take all three doses of hydralazine. ? 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  Reports he was previously on a beta blocker (can not recall which one) but had to be discontinued when pulse rate dropped into 40s.   ? ?Patient has lab work ordered which he has not completed yet. ? ?Current HTN meds:  ?Valsartan 320mg  daily ?Hydralazine 50mg  TID ?Amlodipine 10mg  daily ? ?Previously tried: HCTZ ?BP goal: <130/80 ? ?Home BP readings: See [employer] readings scanned into chart ? ?Wt Readings from Last 3 Encounters:  ?09/07/21 201 lb 9.6 oz (91.4 kg)  ?01/30/19 184 lb 6.4 oz (83.6 kg)  ?12/20/13 185 lb (83.9 kg)  ? ?BP Readings from Last 3 Encounters:  ?09/07/21 (!) 167/95  ?04/22/21 (!) 182/104  ?01/30/19 (!) 187/91  ? ?Pulse Readings from Last 3 Encounters:  ?09/07/21 62  ?04/22/21 (!) 55  ?01/30/19 (!) 52  ? ? ?Renal function: ?CrCl cannot be calculated (Patient's most recent lab result is older than the maximum 21  days allowed.). ? ?Past Medical History:  ?Diagnosis Date  ? Anxiety   ? Arthritis   ? Chest pressure 09/07/2021  ? Depression   ? History of kidney stones   ? Hypertension   ? Pure hypercholesterolemia 09/07/2021  ? Resistant hypertension 09/07/2021  ? Suicidal behavior 2015  ? ? ?Current Outpatient Medications on File Prior to Visit  ?Medication Sig Dispense Refill  ? amLODipine (NORVASC) 10 MG tablet Take 10 mg by mouth every morning.    ? hydrALAZINE (APRESOLINE) 50 MG tablet Take 1 tablet (50 mg total) by mouth 3 (three) times daily. 270 tablet 3  ? sildenafil (VIAGRA) 100 MG tablet Take 1 tablet by mouth as needed.    ? valsartan (DIOVAN) 320 MG tablet Take 1 tablet (320 mg total) by mouth daily. 90 tablet 3  ? ?No current facility-administered medications on file prior to visit.  ? ? ?No Known Allergies ? ? ?Assessment/Plan: ? ?1. Hypertension -  Patient BP in room 153/87 which is above goal of <130/80.  Home readings all above goal as well.  ? ?Instructed patient he should have lab work drawn soon to assess renal function. Patient voiced understanding.  Advised to hold valsartan for 3 days before lab work. ? ?Needs  further BP lowering. Already on max dose amlodipine and valsartan.  Will add on doxazosin at this visit to help with HTN and BPH symptoms.  Patient advised he can continue to take an extra hydralazine as needed. Also advised he can change his amlodipine to nighttime administration since he has been taking all his medications in the morning.  Will recheck in 4 weeks for HTN visit #2 although patient reports he may have to cancel due to copay. ? ?Continue: ?Valsartan 320mg  daily ?Hydralazine 50mg  TID ?Amlodipine 10mg  daily ? ?Start doxazosin 2mg  daily ?Recheck in 4 weeks ? ?Karren Cobble, PharmD, BCACP, [place], CPP ?[address], [address] ?[place], [place], [address] ?Phone: [phone], Fax: [phone]  ? ?

## 2021-10-07 NOTE — Patient Instructions (Addendum)
It was nice talking to you again ? ?We would like your blood pressure to be less than 130/80 ? ?Please continue: ?Valsartan 320mg  daily ?Hydralazine 50mg  three times a day.  You can take an extra tablet as needed ?Amlodipine 10mg  daily ? ?We will start a new medication called doxazosin 2mg .  You will take one tablet once a day ? ?You can move your amlodipine 10mg  to the evening ? ?Please hold your valsartan for 3 days and then have your lab work drawn ? ?Continue to check your blood pressure at home ? ?We will recheck you in 4 weeks ? ? , PharmD, BCACP, CDCES, CPP ?[address] ?[place], , ?Phone: [phone], Fax: [phone]  ?

## 2021-10-08 ENCOUNTER — Telehealth: Payer: Self-pay | Admitting: Pharmacist

## 2021-10-08 NOTE — Telephone Encounter (Signed)
Spoke with patient.  Copays for each office visit will be 120 dollars and he wont be able to afford this.  Will let HTN clinic know. ? ?Asked patient what copay for new med was but he reports he has not picked up yet.  Asked patient to call back if copay is unaffordable and then can consider getting social worker involved or check goodrx pricing.   ? ?Patient will send in home BP readings through my chart or via email. ?

## 2021-10-18 NOTE — Telephone Encounter (Signed)
Isabel/Jackie ? ?Is there any type of copay assistance available?   ? ?Thanks,  ?Erasmo Downer ?

## 2021-10-19 NOTE — Telephone Encounter (Signed)
Thank you for checking.  I didn't think there was anything we could do about the OV copays, but was worth a shot ?

## 2021-11-04 ENCOUNTER — Ambulatory Visit: Payer: BC Managed Care – PPO

## 2021-11-04 NOTE — Progress Notes (Deleted)
? ?Assessment/Plan: ? ?1. Hypertension -  Patient BP in room 153/87 which is above goal of <130/80.  Home readings all above goal as well.  ? ?Instructed patient he should have lab work drawn soon to assess renal function. Patient voiced understanding.  Advised to hold valsartan for 3 days before lab work. ? ?Needs further BP lowering. Already on max dose amlodipine and valsartan.  Will add on doxazosin at this visit to help with HTN and BPH symptoms.  Patient advised he can continue to take an extra hydralazine as needed. Also advised he can change his amlodipine to nighttime administration since he has been taking all his medications in the morning.  Will recheck in 4 weeks for HTN visit #2 although patient reports he may have to cancel due to copay. ? ?Continue: ?Valsartan 320mg  daily ?Hydralazine 50mg  TID ?Amlodipine 10mg  daily ? ?Start doxazosin 2mg  daily ?Recheck in 4 weeks ? ? , PharmD, BCACP, CDCES, CPP ?[address] ?[place], , [place] ?Phone: [phone], Fax: [phone]  ? ? ? ? ? ? ?11/04/2021 ?Michael Mccoy ?10-11-1965 ?[phone] ? ? ?HPI: ?Michael Mccoy is a 56 y.o. male referred by Dr. 05/05/1966 for advanced hypertension clinic follow up.  In addition to hypertension, his medical history is significant for BPH, and depression.  He was referred to the Advanced Hypertension clinic by Dr. [id number] and seen by Dr. Erenest Blank on 09/07/21.  At that time he was amenable to the [employer] BP research project and randomized to group 1.   He was then seen a month ago by Duke Salvia PharmD, and his bigger concern was OV copay costs.  He had sporadically checked his BP, finding reading ranged from 158/92 to 189/105.  Heart rate typically in the mid 60's.  At his first follow up visit with Patrici Ranks, doxazosin 2 mg nightly was added.   He has not had requested labs to check for hyperaldosteronism, hyperlipidemia and metabolic panel.   ? ?Blood Pressure Goal:  130/80 ? ?Current  Medications:   ?Valsartan 320mg  daily ?Hydralazine 50mg  TID ?Amlodipine 10mg  daily ?Doxazosin 2 mg qhs ? ?Family Hx: ? ?Social Hx:  ? ?Diet:  ? ?Exercise:  ? ?Home BP readings:  ? ?Intolerances: HCTZ - dehydrated easily while working as [employer] in summer months ? Beta blockers - bradycardia ? ?Labs:  ? ? ?Wt Readings from Last 3 Encounters:  ?10/07/21 199 lb (90.3 kg)  ?09/07/21 201 lb 9.6 oz (91.4 kg)  ?01/30/19 184 lb 6.4 oz (83.6 kg)  ? ?BP Readings from Last 3 Encounters:  ?10/07/21 (!) 153/87  ?09/07/21 (!) 167/95  ?04/22/21 (!) 182/104  ? ?Pulse Readings from Last 3 Encounters:  ?10/07/21 68  ?09/07/21 62  ?04/22/21 (!) 55  ? ? ?Current Outpatient Medications  ?Medication Sig Dispense Refill  ? amLODipine (NORVASC) 10 MG tablet Take 10 mg by mouth every morning.    ? doxazosin (CARDURA) 2 MG tablet Take 1 tablet (2 mg total) by mouth daily. 30 tablet 1  ? hydrALAZINE (APRESOLINE) 50 MG tablet Take 1 tablet (50 mg total) by mouth 3 (three) times daily. 270 tablet 3  ? tadalafil (CIALIS) 10 MG tablet Take 10 mg by mouth daily as needed.    ? valsartan (DIOVAN) 320 MG tablet Take 1 tablet (320 mg total) by mouth daily. 90 tablet 3  ? ?No current facility-administered medications for this visit.  ? ? ?No Known Allergies ? ?Past Medical History:  ?Diagnosis Date  ? Anxiety   ?  Arthritis   ? Chest pressure 09/07/2021  ? Depression   ? History of kidney stones   ? Hypertension   ? Pure hypercholesterolemia 09/07/2021  ? Resistant hypertension 09/07/2021  ? Suicidal behavior 2015  ? ? ?There were no vitals taken for this visit. ? ?No problem-specific Assessment & Plan notes found for this encounter. ? ? ?Phillips Hay PharmD CPP [facility] ?[facility] ?[address] ?[place], [place] [address] ?[phone] ? ?

## 2021-11-05 ENCOUNTER — Telehealth: Payer: Self-pay | Admitting: Pharmacist

## 2021-11-05 NOTE — Telephone Encounter (Signed)
Patient canceled yesterdays appointment due to copay.  Called patient to check BP and med compliance. No answer, LMOM ?

## 2021-11-26 ENCOUNTER — Telehealth: Payer: Self-pay

## 2021-11-26 DIAGNOSIS — Z006 Encounter for examination for normal comparison and control in clinical research program: Secondary | ICD-10-CM

## 2021-11-26 NOTE — Telephone Encounter (Signed)
Subject #:[id number] ? ?Follow Up Visit #:  ? ?Date of Contact:          ? ?Contact via [ X] Phone  [ ]  Letter  ? ?Successful in contacting subject: [ X] Yes  [ ]  No  ?[ X] Group 1 [ ]  Group 2  ? ?Pt stated he has not been checking his blood pressures. He asked for me to follow up with him in 2 weeks. He stated he would start checking them tomorrow twice a day. I will call and follow up with pt on May 5th.  ? ? ? ?

## 2022-05-01 IMAGING — CR DG HAND COMPLETE 3+V*R*
3 series · 3 of 3 positions shown · non-contrast
Comparison: None.

CLINICAL DATA: Fell, pain

EXAM:
RIGHT HAND - COMPLETE 3+ VIEW

[x hand pa right]
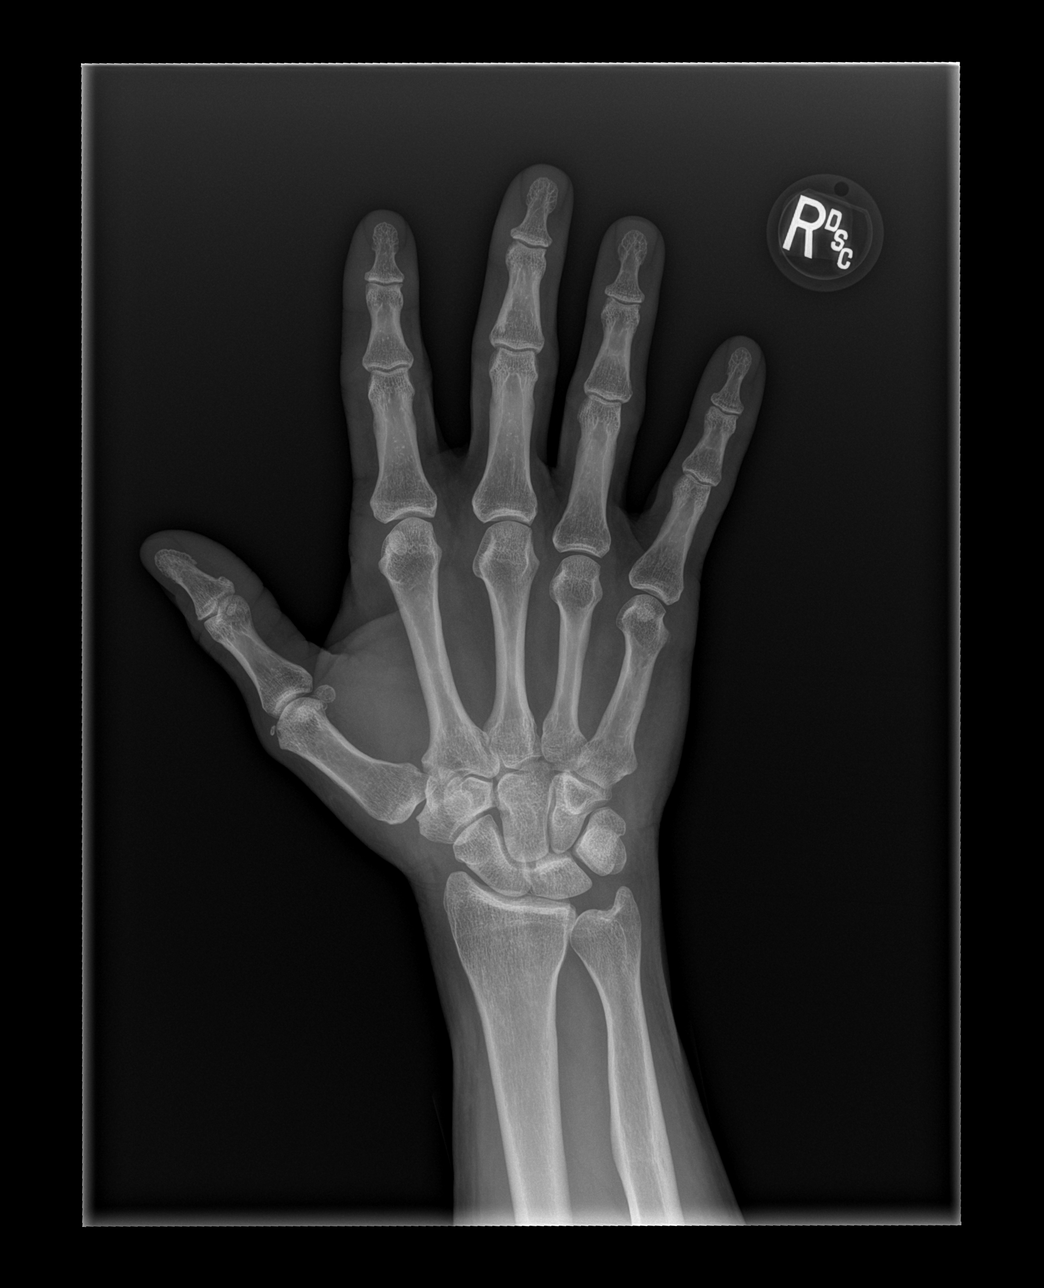

[x hand obl right]
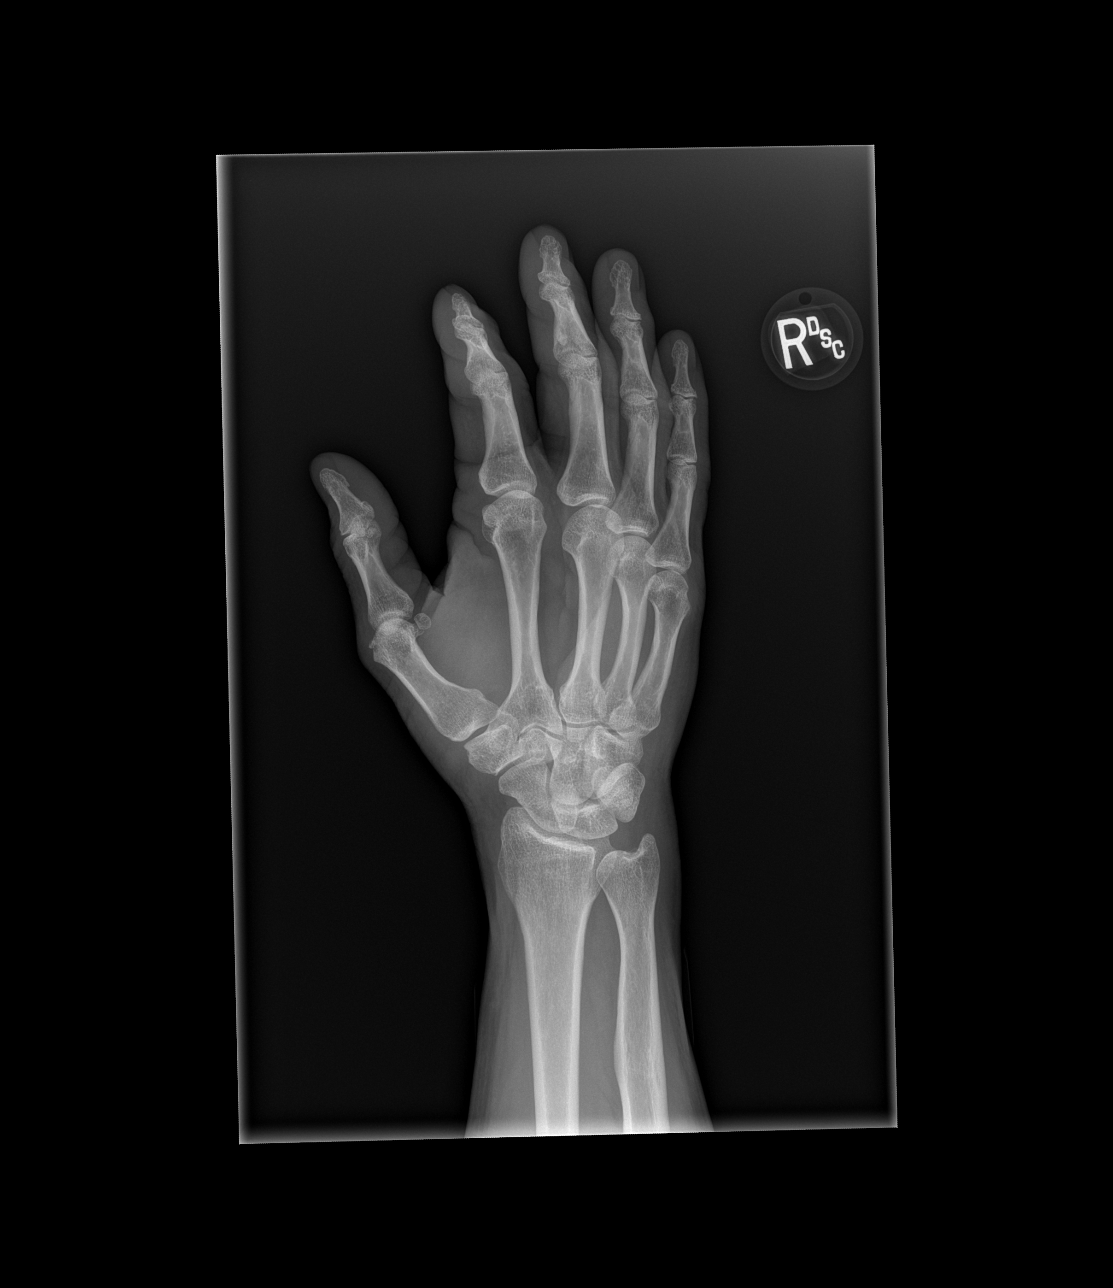

[x hand lat right]
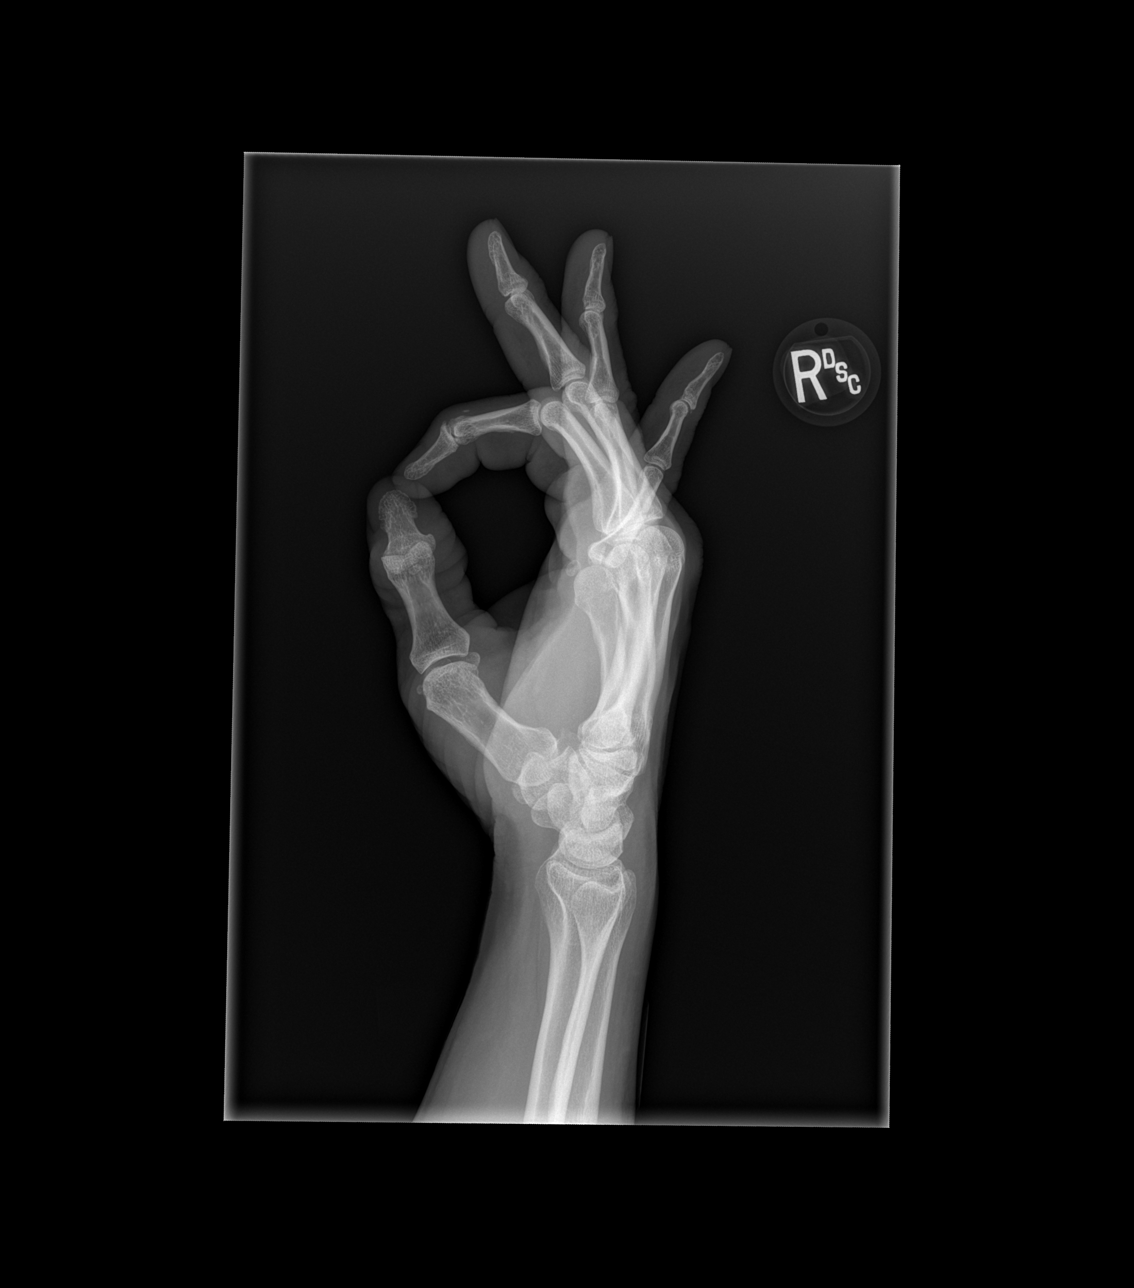

[3 of 3 positions shown; findings below may reference images not displayed]

FINDINGS: Frontal, oblique, and lateral views of the right hand are obtained.
No fracture, subluxation, or dislocation. Joint space narrowing and
osteophyte formation of the second distal interphalangeal joint
consistent with osteoarthritis. Soft tissues are unremarkable.
IMPRESSION: 1. No acute displaced fracture.

## 2022-05-01 IMAGING — CR DG LUMBAR SPINE COMPLETE 4+V
5 series · 5 of 5 positions shown · non-contrast
Comparison: 01/20/2019

CLINICAL DATA: Fell at work

EXAM:
LUMBAR SPINE - COMPLETE 4+ VIEW

[t lumbar spine ap]
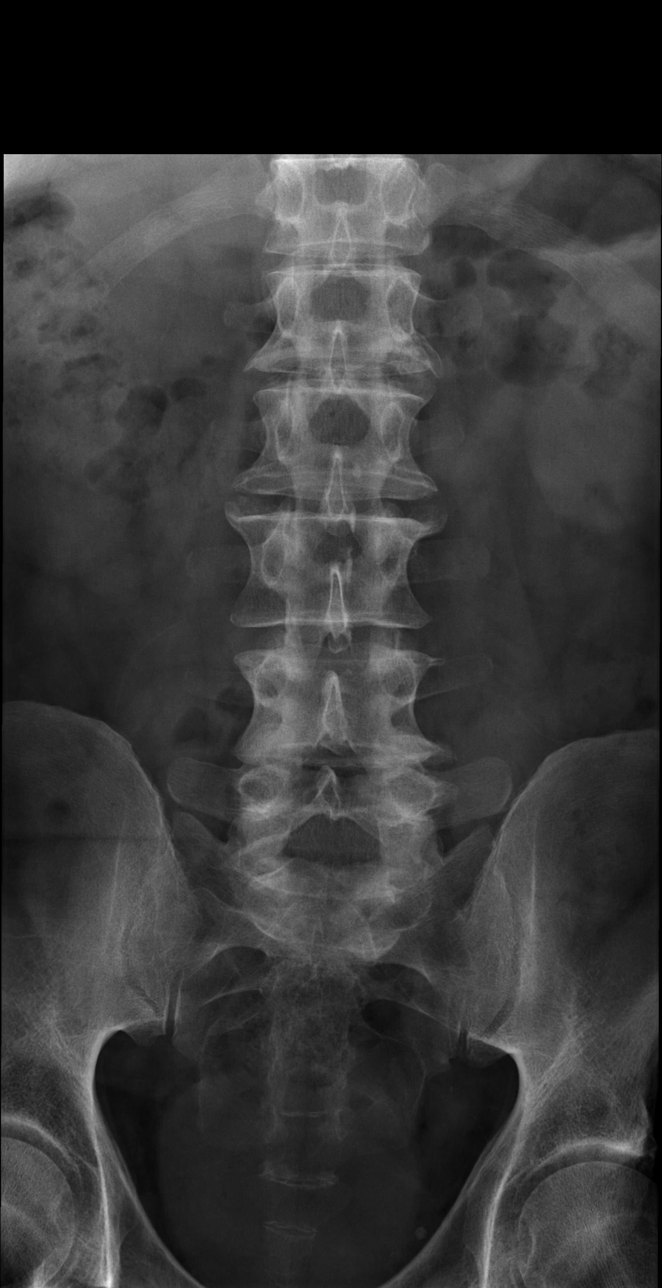

[t lumbar spine obl (1 of 2)]
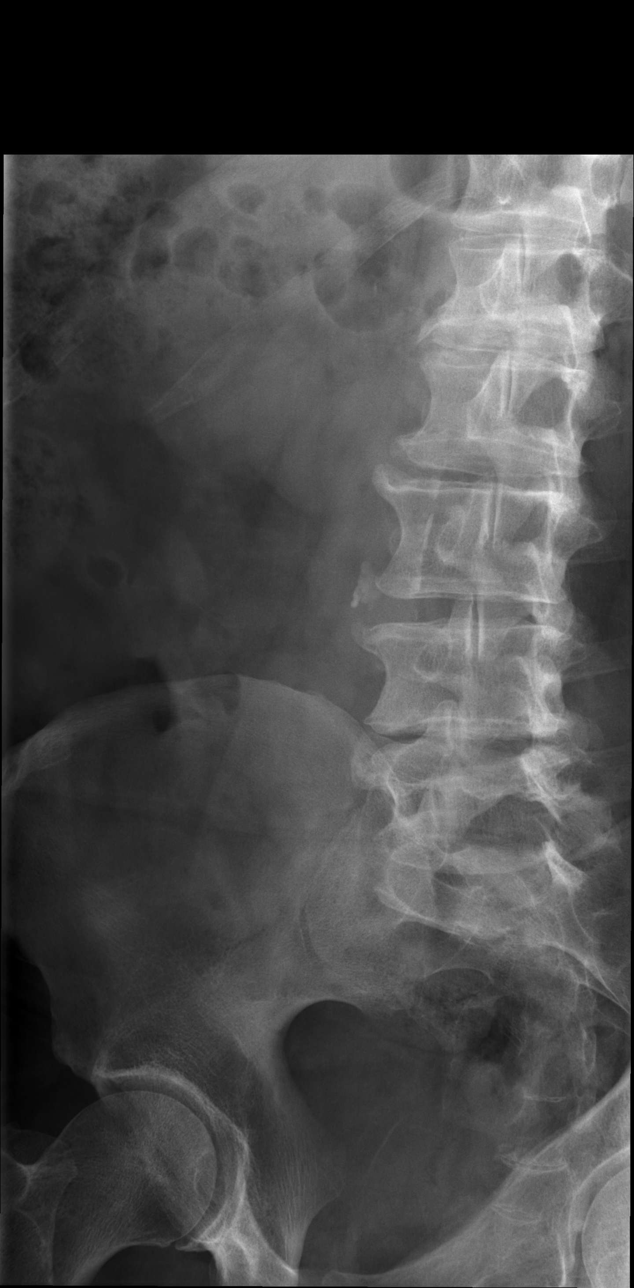

[t lumbar spine obl (2 of 2)]
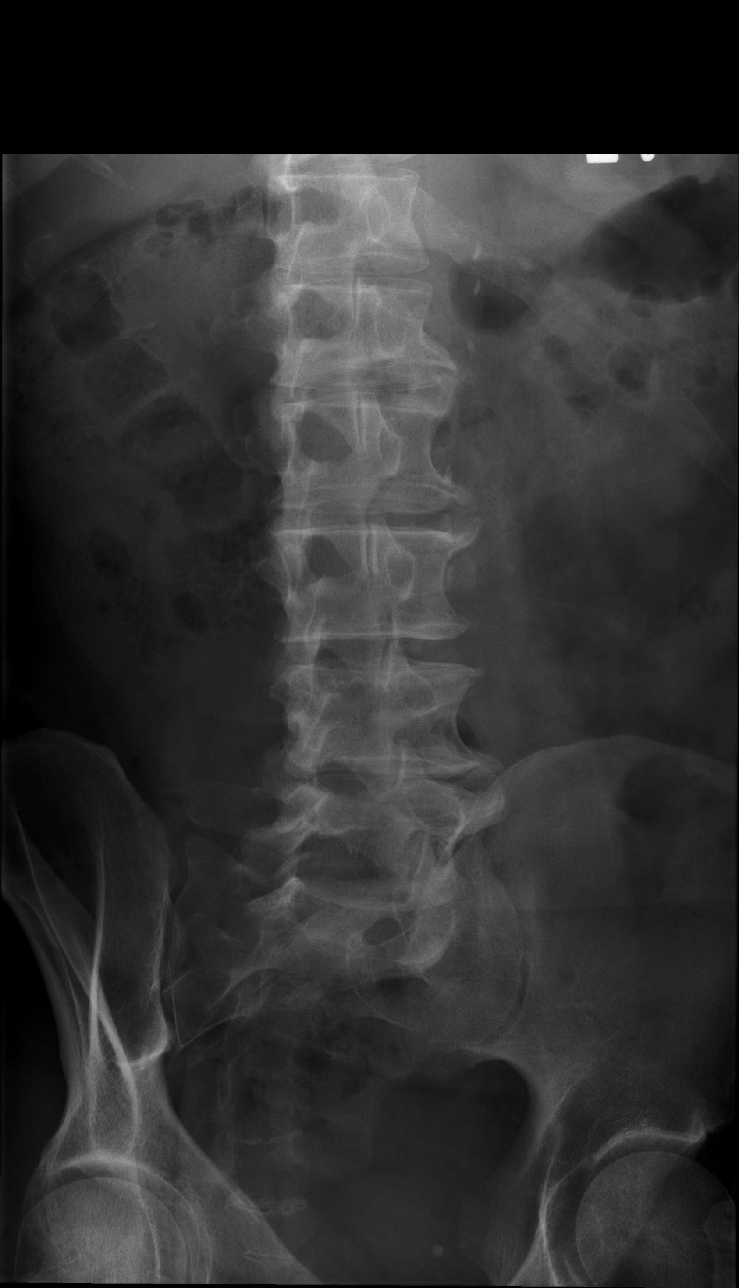

[t lumbar spine lat]
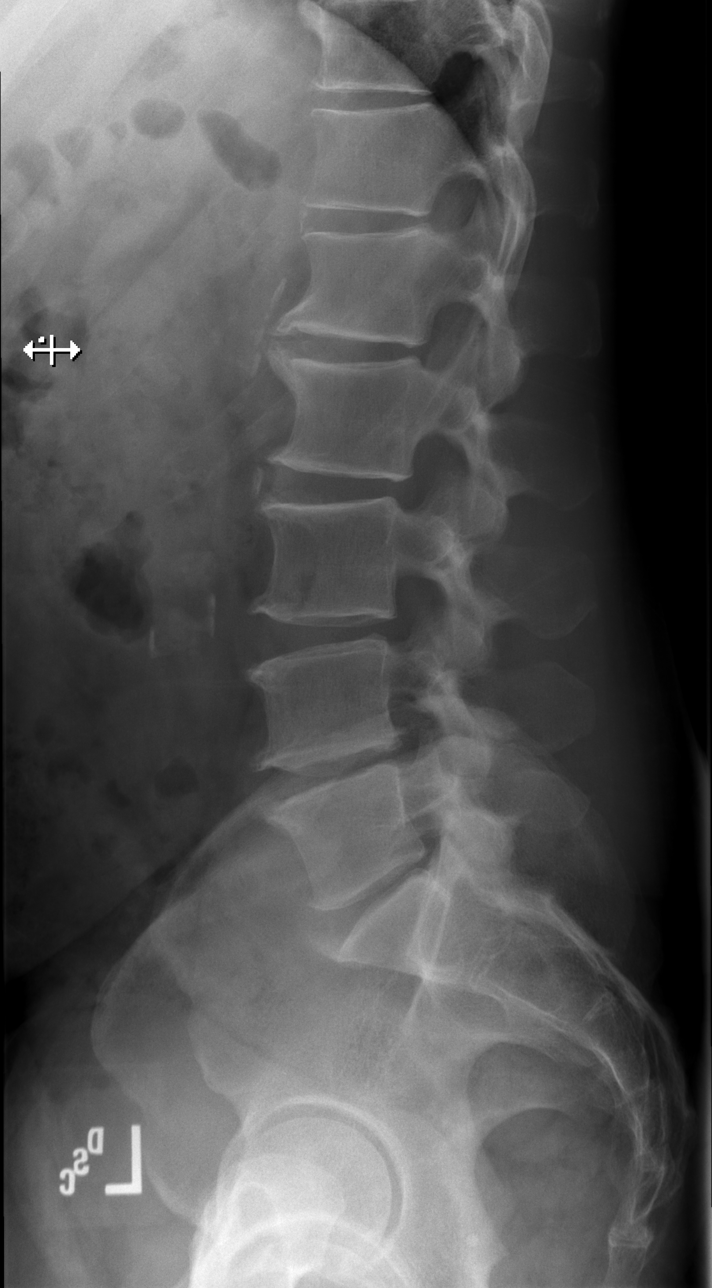

[t lumbar l-5 s-1 spot]
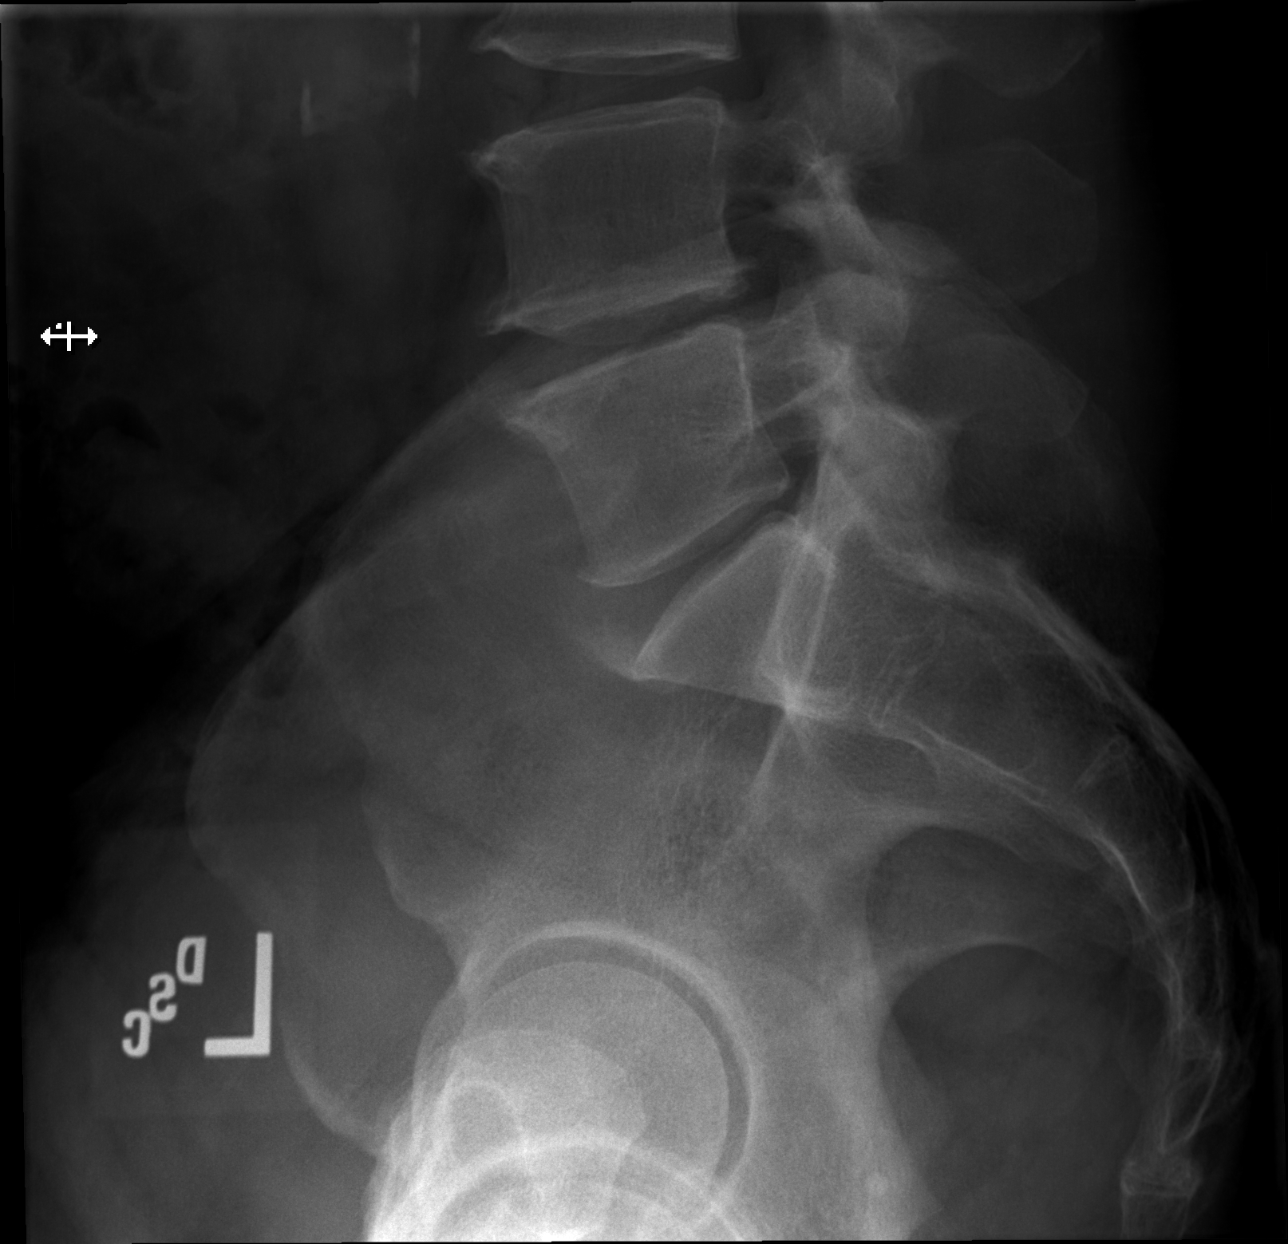

[5 of 5 positions shown; findings below may reference images not displayed]

FINDINGS: Frontal, bilateral oblique, lateral views of the lumbar spine are
obtained. There are 5 non-rib-bearing lumbar type vertebral bodies
in anatomic alignment. No acute displaced fracture. Multilevel
lumbar spondylosis and facet hypertrophy again noted and unchanged.
Sacroiliac joints are unremarkable.
IMPRESSION: 1. Stable multilevel lumbar spondylosis and facet hypertrophy. No
acute fracture.

## 2022-06-08 ENCOUNTER — Ambulatory Visit (HOSPITAL_BASED_OUTPATIENT_CLINIC_OR_DEPARTMENT_OTHER): Payer: BC Managed Care – PPO | Admitting: Family Medicine

## 2022-06-08 ENCOUNTER — Encounter (HOSPITAL_BASED_OUTPATIENT_CLINIC_OR_DEPARTMENT_OTHER): Payer: Self-pay | Admitting: Family Medicine

## 2022-06-08 VITALS — BP 233/120 | HR 56 | Temp 97.6°F | Ht 72.0 in | Wt 190.1 lb

## 2022-06-08 DIAGNOSIS — G4730 Sleep apnea, unspecified: Secondary | ICD-10-CM

## 2022-06-08 DIAGNOSIS — E7800 Pure hypercholesterolemia, unspecified: Secondary | ICD-10-CM

## 2022-06-08 DIAGNOSIS — I1A0 Resistant hypertension: Secondary | ICD-10-CM | POA: Diagnosis not present

## 2022-06-08 MED ORDER — HYDRALAZINE HCL 50 MG PO TABS: 50.0000 mg | ORAL_TABLET | Freq: Three times a day (TID) | ORAL | 1 refills | Status: DC

## 2022-06-08 MED ORDER — VALSARTAN 320 MG PO TABS: 320.0000 mg | ORAL_TABLET | Freq: Every day | ORAL | 1 refills | Status: DC

## 2022-06-08 NOTE — Progress Notes (Signed)
New Patient Office Visit  Subjective    Patient ID: Michael Mccoy, male    DOB: [birth year]  Age: 56 y.o. MRN: [id number]  CC:  Chief Complaint  Patient presents with   New Patient (Initial Visit)    Pt here to establish new care, pt stated he need refills on all of his medications     HPI Michael Mccoy presents to establish care Last PCP - Sadie Haber at [facility], Dr. Jacelyn Grip, he has retired  HTN: Patient with history of resistant hypertension for which he was following with cardiology.  Last appointment with cardiology was about 9 months ago.  Most recent medication regimen included amlodipine 10 mg, hydralazine 50 mg, valsartan 320 mg.  He currently has been without all medications and is being refill today.  He does have blood pressure cuff at home, however checks it irregularly.  Denies any current issues with chest pain.  Has occasionally had some headaches in recent weeks/months.  He does indicate that he has been feeling "off" and thinks this is related to his blood pressure being elevated.  Patient also reports history of erectile dysfunction, feels that it primarily has been related to a side effect of blood pressure medication.  This has been managed with Cialis, indicates that he does still have refills of his medication available.  He does report that when he saw his dentist recently and that he was told that he "has signs of sleep apnea" based on dental exam.  He does indicate that he will have snoring at times, has not been told about specific apneic episodes while sleeping.  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  He generally in the morning would not feel entirely rested upon waking, does find that it is easier for him to fall asleep during the day, particularly when he gets home in the afternoon.  Patient is originally from [place], has been here for a long time. Works for [employer].  Outside of work, he enjoys riding motorcycle, being active, hiking.  Outpatient Encounter Medications as of 06/08/2022  Medication Sig   amLODipine (NORVASC) 10 MG tablet Take 10 mg by mouth every morning. (Patient not taking: Reported on 06/08/2022)   doxazosin (CARDURA) 2 MG tablet Take 1 tablet (2 mg total) by mouth daily. (Patient not taking: Reported on 06/08/2022)   hydrALAZINE (APRESOLINE) 50 MG tablet Take 1 tablet (50 mg total) by mouth 3 (three) times daily.   tadalafil (CIALIS) 10 MG tablet Take 10 mg by mouth daily as needed. (Patient not taking: Reported on 06/08/2022)   valsartan (DIOVAN) 320 MG tablet Take 1 tablet (320 mg total) by mouth daily.   [DISCONTINUED] hydrALAZINE (APRESOLINE) 50 MG tablet Take 1 tablet (50 mg total) by mouth 3 (three) times daily.   [DISCONTINUED] valsartan (DIOVAN) 320 MG tablet Take 1 tablet (320 mg total) by mouth daily. (Patient not taking: Reported on 06/08/2022)   No facility-administered encounter medications on file as of 06/08/2022.    Past Medical History:  Diagnosis Date   Anxiety    Arthritis    Chest pressure 09/07/2021   Depression    History of kidney stones    Hypertension    Pure hypercholesterolemia 09/07/2021   Resistant hypertension 09/07/2021   Suicidal behavior 2015    Past Surgical History:  Procedure Laterality Date   ANKLE SURGERY Left    COLONOSCOPY  08/2018   CYSTOSCOPY WITH  RETROGRADE PYELOGRAM, URETEROSCOPY AND STENT PLACEMENT Right 01/30/2019   Procedure: CYSTOSCOPY WITH RETROGRADE PYELOGRAM, URETEROSCOPY AND STENT PLACEMENT;  Surgeon: Alexis Frock, MD;  Location: [facility];  Service: Urology;  Laterality: Right;  90 MINS   HOLMIUM LASER APPLICATION Right 0000000   Procedure: HOLMIUM LASER APPLICATION;  Surgeon: Alexis Frock, MD;  Location: [facility];  Service: Urology;  Laterality: Right;   KIDNEY STONE SURGERY     Several    Family History  Problem Relation Age of Onset    Bradycardia Mother    Heart attack Father    COPD Father    Bradycardia Father     Social History   Socioeconomic History   Marital status: Divorced    Spouse name: Not on file   Number of children: Not on file   Years of education: Not on file   Highest education level: Not on file  Occupational History   Not on file  Tobacco Use   Smoking status: Never   Smokeless tobacco: Never  Vaping Use   Vaping Use: Never used  Substance and Sexual Activity   Alcohol use: Yes   Drug use: Yes    Types: Marijuana    Comment: occ. last time several weeks ago   Sexual activity: Not on file  Other Topics Concern   Not on file  Social History Narrative   Not on file   Social Determinants of Health   Financial Resource Strain: Low Risk  (09/07/2021)   Overall Financial Resource Strain (CARDIA)    Difficulty of Paying Living Expenses: Not hard at all  Food Insecurity: No Food Insecurity (09/07/2021)   Hunger Vital Sign    Worried About Running Out of Food in the Last Year: Never true    [place] in the Last Year: Never true  Transportation Needs: No Transportation Needs (09/07/2021)   PRAPARE - Hydrologist (Medical): No    Lack of Transportation (Non-Medical): No  Physical Activity: Unknown (09/07/2021)   Exercise Vital Sign    Days of Exercise per Week: Not on file    Minutes of Exercise per Session: 0 min  Stress: Not on file  Social Connections: Not on file  Intimate Partner Violence: Not on file    Objective    BP (!) 233/120   Pulse (!) 56   Temp 97.6 F (36.4 C) (Oral)   Ht 6' (1.829 m)   Wt 190 lb 1.6 oz (86.2 kg)   SpO2 100%   BMI 25.78 kg/m   Physical Exam  56 year old male in no acute distress Cardiovascular exam with regular rate and rhythm, no murmur appreciated Lungs clear to auscultation bilaterally  Assessment & Plan:   Problem List Items Addressed This Visit       Cardiovascular and Mediastinum   Resistant  hypertension - Primary    Blood pressure very elevated in office today on initial reading.  Did improve some on recheck, still elevated.  Recommend gradually resuming previous antihypertensives to slowly gain better control of blood pressure.  Discussed ER precautions with patient today. Discussed that we will need to gradually gain better control of blood pressure and avoid rapid decrease in blood pressure over short period of time.  We will initially have patient resume valsartan and hydralazine at this time.  Recommend that he closely monitor his blood pressure at home.  Recommend bringing blood pressure log to next appointment for review Anticipate that at  next appointment, we will resume amlodipine at that time He last saw cardiology in January with recommendation for 7-month follow-up and patient has yet to follow-up with them - recommend that he reach out to cardiology office to schedule follow-up visit for continued evaluation and management of resistant hypertension.  It does appear that they were also planning to complete additional evaluation including labs related to resistant hypertension, recommend discussing with cardiology office.  He indicates that he does have their contact information and he will reach out to them Plan for close follow-up in about 5 to 7 days to assess progress with resuming medications and to reassess blood pressure and determine next steps in management, likely resumption of third antihypertensive agent at that time      Relevant Medications   valsartan (DIOVAN) 320 MG tablet   hydrALAZINE (APRESOLINE) 50 MG tablet     Respiratory   Sleep apnea    Possible can for underlying sleep apnea given reported history as well as Epworth sleepiness score of 19.  Given potential concern for underlying sleep apnea, will refer to sleep medicine specialist for further evaluation, consideration of sleep study      Relevant Orders   Ambulatory referral to Neurology     Other    Pure hypercholesterolemia    Was following up with lipid clinic through cardiology, recommend arranging for this follow-up as well when contacting cardiology office.      Relevant Medications   valsartan (DIOVAN) 320 MG tablet   hydrALAZINE (APRESOLINE) 50 MG tablet    Return in about 1 week (around 06/15/2022) for HTN.   Summer Parthasarathy J De [place], MD

## 2022-06-08 NOTE — Assessment & Plan Note (Signed)
Possible can for underlying sleep apnea given reported history as well as Epworth sleepiness score of 19.  Given potential concern for underlying sleep apnea, will refer to sleep medicine specialist for further evaluation, consideration of sleep study

## 2022-06-08 NOTE — Assessment & Plan Note (Signed)
Was following up with lipid clinic through cardiology, recommend arranging for this follow-up as well when contacting cardiology office.

## 2022-06-08 NOTE — Assessment & Plan Note (Signed)
Blood pressure very elevated in office today on initial reading.  Did improve some on recheck, still elevated.  Recommend gradually resuming previous antihypertensives to slowly gain better control of blood pressure.  Discussed ER precautions with patient today. Discussed that we will need to gradually gain better control of blood pressure and avoid rapid decrease in blood pressure over short period of time.  We will initially have patient resume valsartan and hydralazine at this time.  Recommend that he closely monitor his blood pressure at home.  Recommend bringing blood pressure log to next appointment for review Anticipate that at next appointment, we will resume amlodipine at that time He last saw cardiology in January with recommendation for 79-month follow-up and patient has yet to follow-up with them - recommend that he reach out to cardiology office to schedule follow-up visit for continued evaluation and management of resistant hypertension.  It does appear that they were also planning to complete additional evaluation including labs related to resistant hypertension, recommend discussing with cardiology office.  He indicates that he does have their contact information and he will reach out to them Plan for close follow-up in about 5 to 7 days to assess progress with resuming medications and to reassess blood pressure and determine next steps in management, likely resumption of third antihypertensive agent at that time

## 2022-06-08 NOTE — Patient Instructions (Signed)
  Medication Instructions:  Your physician recommends that you continue on your current medications as directed. Please refer to the Current Medication list given to you today. --If you need a refill on any your medications before your next appointment, please call your pharmacy first. If no refills are authorized on file call the office.-- Lab Work: Your physician has recommended that you have lab work today: No If you have labs (blood work) drawn today and your tests are completely normal, you will receive your results via [place] a phone call from our staff.  Please ensure you check your voicemail in the event that you authorized detailed messages to be left on a delegated number. If you have any lab test that is abnormal or we need to change your treatment, we will call you to review the results.  Referrals/Procedures/Imaging: Yes, Neurology  Follow-Up: Your next appointment:   Your physician recommends that you schedule a follow-up appointment in: 5-7 days with Dr. de [place].  You will receive a text message or e-mail with a link to a survey about your care and experience with [place] today! We would greatly appreciate your feedback!   Thanks for letting us be apart of your health journey!!  Primary Care and Sports Medicine   Dr. Arlina Robes [place]   We encourage you to activate your patient portal called "MyChart".  Sign up information is provided on this After Visit Summary.  MyChart is used to connect with patients for Virtual Visits (Telemedicine).  Patients are able to view lab/test results, encounter notes, upcoming appointments, etc.  Non-urgent messages can be sent to your provider as well. To learn more about what you can do with MyChart, please visit --  [email or web address].

## 2022-06-14 ENCOUNTER — Ambulatory Visit (HOSPITAL_BASED_OUTPATIENT_CLINIC_OR_DEPARTMENT_OTHER): Payer: BC Managed Care – PPO | Admitting: Family Medicine

## 2022-06-14 ENCOUNTER — Encounter (HOSPITAL_BASED_OUTPATIENT_CLINIC_OR_DEPARTMENT_OTHER): Payer: Self-pay | Admitting: Family Medicine

## 2022-06-14 VITALS — BP 164/98 | HR 67 | Temp 97.8°F | Ht 72.0 in | Wt 196.4 lb

## 2022-06-14 DIAGNOSIS — G4730 Sleep apnea, unspecified: Secondary | ICD-10-CM | POA: Diagnosis not present

## 2022-06-14 DIAGNOSIS — N529 Male erectile dysfunction, unspecified: Secondary | ICD-10-CM | POA: Diagnosis not present

## 2022-06-14 DIAGNOSIS — I1A0 Resistant hypertension: Secondary | ICD-10-CM

## 2022-06-14 MED ORDER — TADALAFIL 10 MG PO TABS: 10.0000 mg | ORAL_TABLET | Freq: Every day | ORAL | 3 refills | Status: DC | PRN

## 2022-06-14 MED ORDER — AMLODIPINE BESYLATE 10 MG PO TABS: 10.0000 mg | ORAL_TABLET | Freq: Every day | ORAL | 1 refills | Status: DC

## 2022-06-14 NOTE — Patient Instructions (Signed)
  Medication Instructions:  Your physician recommends that you continue on your current medications as directed. Please refer to the Current Medication list given to you today. --If you need a refill on any your medications before your next appointment, please call your pharmacy first. If no refills are authorized on file call the office.-- Lab Work: Your physician has recommended that you have lab work today: No If you have labs (blood work) drawn today and your tests are completely normal, you will receive your results via [place] a phone call from our staff.  Please ensure you check your voicemail in the event that you authorized detailed messages to be left on a delegated number. If you have any lab test that is abnormal or we need to change your treatment, we will call you to review the results.  Referrals/Procedures/Imaging: No  Follow-Up: Your next appointment:   Your physician recommends that you schedule a follow-up appointment in: 2-3 weeks for htn with Dr. de [place].  You will receive a text message or e-mail with a link to a survey about your care and experience with [place] today! We would greatly appreciate your feedback!   Thanks for letting us be apart of your health journey!!  Primary Care and Sports Medicine   Dr. Arlina Robes [place]   We encourage you to activate your patient portal called "MyChart".  Sign up information is provided on this After Visit Summary.  MyChart is used to connect with patients for Virtual Visits (Telemedicine).  Patients are able to view lab/test results, encounter notes, upcoming appointments, etc.  Non-urgent messages can be sent to your provider as well. To learn more about what you can do with MyChart, please visit --  [email or web address].

## 2022-06-14 NOTE — Progress Notes (Signed)
    Procedures performed today:    None.  Independent interpretation of notes and tests performed by another provider:   None.  Brief History, Exam, Impression, and Recommendations:    BP (!) 164/98   Pulse 67   Temp 97.8 F (36.6 C) (Oral)   Ht 6' (1.829 m)   Wt 196 lb 6.4 oz (89.1 kg)   SpO2 100%   BMI 26.64 kg/m   Resistant hypertension Blood pressure continues to be elevated, however is certainly improved compared to last office visit.  He has check blood pressure once at home since last office visit.  He has been taking valsartan and hydralazine which were resumed at last office visit.  No current issues with chest pain or headaches.  He has not contacted cardiology office yet to schedule follow-up visit with them. At this time, we will look to resume amlodipine in addition to valsartan and hydralazine.  Recommend intermittent monitoring of blood pressure at home, DASH diet.  Again advised patient to contact cardiology office to schedule follow-up We will plan for close follow-up in about 2 to 3 weeks to monitor progress  Sleep apnea Was referred to sleep medicine specialist.  He does plan to schedule this, however wants to arrange for this after the new year due to insurance.  Erectile dysfunction Patient with history of erectile dysfunction issues which has previously been managed with tadalafil.  He is requesting refill of this today.  Previously, we had wanted to get better control of blood pressure before resuming this medication.  Given that there has been some improvement in blood pressure thus far, will allow for resumption of medication at this time to assist with underlying erectile dysfunction.  Also discussed that uncontrolled high blood pressure can also have negative effect in regards to erectile dysfunction  Return in about 3 weeks (around 07/05/2022) for HTN.   ___________________________________________ Salome Cozby de [place], MD, ABFM, [facility] and  [place]

## 2022-06-14 NOTE — Assessment & Plan Note (Signed)
Was referred to sleep medicine specialist.  He does plan to schedule this, however wants to arrange for this after the new year due to insurance.

## 2022-06-14 NOTE — Assessment & Plan Note (Signed)
Patient with history of erectile dysfunction issues which has previously been managed with tadalafil.  He is requesting refill of this today.  Previously, we had wanted to get better control of blood pressure before resuming this medication.  Given that there has been some improvement in blood pressure thus far, will allow for resumption of medication at this time to assist with underlying erectile dysfunction.  Also discussed that uncontrolled high blood pressure can also have negative effect in regards to erectile dysfunction

## 2022-06-14 NOTE — Assessment & Plan Note (Signed)
Blood pressure continues to be elevated, however is certainly improved compared to last office visit.  He has check blood pressure once at home since last office visit.  He has been taking valsartan and hydralazine which were resumed at last office visit.  No current issues with chest pain or headaches.  He has not contacted cardiology office yet to schedule follow-up visit with them. At this time, we will look to resume amlodipine in addition to valsartan and hydralazine.  Recommend intermittent monitoring of blood pressure at home, DASH diet.  Again advised patient to contact cardiology office to schedule follow-up We will plan for close follow-up in about 2 to 3 weeks to monitor progress

## 2022-07-07 ENCOUNTER — Encounter (HOSPITAL_BASED_OUTPATIENT_CLINIC_OR_DEPARTMENT_OTHER): Payer: Self-pay

## 2022-07-12 ENCOUNTER — Ambulatory Visit (HOSPITAL_BASED_OUTPATIENT_CLINIC_OR_DEPARTMENT_OTHER): Payer: BC Managed Care – PPO | Admitting: Family Medicine

## 2022-07-12 ENCOUNTER — Encounter (HOSPITAL_BASED_OUTPATIENT_CLINIC_OR_DEPARTMENT_OTHER): Payer: Self-pay | Admitting: Family Medicine

## 2022-07-12 VITALS — BP 165/91 | HR 70 | Ht 72.0 in | Wt 194.4 lb

## 2022-07-12 DIAGNOSIS — I1A0 Resistant hypertension: Secondary | ICD-10-CM

## 2022-07-12 DIAGNOSIS — Z23 Encounter for immunization: Secondary | ICD-10-CM

## 2022-07-12 DIAGNOSIS — M25559 Pain in unspecified hip: Secondary | ICD-10-CM

## 2022-07-12 NOTE — Progress Notes (Signed)
    Procedures performed today:    None.  Independent interpretation of notes and tests performed by another provider:   None.  Brief History, Exam, Impression, and Recommendations:    BP (!) 165/91 (BP Location: Right Arm, Patient Position: Sitting, Cuff Size: Large)   Pulse 70   Ht 6' (1.829 m)   Wt 194 lb 6.4 oz (88.2 kg)   SpO2 100%   BMI 26.37 kg/m   Resistant hypertension Blood pressure remains above goal in office today, similar to prior office reading.  He has not been checking his blood pressure at home.  He does continue with amlodipine, valsartan, hydralazine as prescribed previously.  He has not arranged for follow-up appointment with cardiology.  Denies any current issues of chest pain or headache. Blood pressure continues to be above goal, again discussed need for arranging follow-up appointment with cardiology. We will continue with current medication regimen.  Recommend intermittent monitoring of blood pressure at home, DASH diet And additionally needs to have further evaluation for sleep apnea completed.  Patient is waiting to complete this in the new year  Hip pain Has noticed for least the last few weeks.  Reports that left hip is worse than right hip.  Pain mostly over lateral aspect of hip.  Denies any numbness or tingling.  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  He has utilized NSAIDs and Tylenol to help with pain, does get some relief with these.  Denies any history of hip issues in the past. On exam, patient with normal gait.  No tenderness to palpation over right greater trochanter, tenderness to palpation present over left greater trochanter.  Normal active and passive range of motion bilateral hips.  Normal strength for hip abduction, adduction, flexion.  Negative logroll bilaterally. Feel the current symptoms are most likely related to greater trochanteric pain syndrome, mostly over the  left hip.  Discussed treatment recommendations including OTC medications, home exercises, physical therapy, considerations for local injection.  At this time we will proceed with OTC medications and home exercises.  Advised that if he would like referral to physical therapy placed, he can let us know at any time.  Can consider injection in the future if symptoms persist despite conservative measures If not responding as expected, consider imaging  Return in about 6 weeks (around 08/23/2022) for hip pain, HTN.   ___________________________________________ Jaquita Bessire [name], MD, ABFM, [facility] Primary [facility] and Sports Medicine [facility]

## 2022-07-12 NOTE — Assessment & Plan Note (Addendum)
Blood pressure remains above goal in office today, similar to prior office reading.  He has not been checking his blood pressure at home.  He does continue with amlodipine, valsartan, hydralazine as prescribed previously.  He has not arranged for follow-up appointment with cardiology.  Denies any current issues of chest pain or headache. Blood pressure continues to be above goal, again discussed need for arranging follow-up appointment with cardiology. We will continue with current medication regimen.  Recommend intermittent monitoring of blood pressure at home, DASH diet And additionally needs to have further evaluation for sleep apnea completed.  Patient is waiting to complete this in the new year

## 2022-07-12 NOTE — Assessment & Plan Note (Signed)
Has noticed for least the last few weeks.  Reports that left hip is worse than right hip.  Pain mostly over lateral aspect of hip.  Denies any numbness or tingling.  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  He has utilized NSAIDs and Tylenol to help with pain, does get some relief with these.  Denies any history of hip issues in the past. On exam, patient with normal gait.  No tenderness to palpation over right greater trochanter, tenderness to palpation present over left greater trochanter.  Normal active and passive range of motion bilateral hips.  Normal strength for hip abduction, adduction, flexion.  Negative logroll bilaterally. Feel the current symptoms are most likely related to greater trochanteric pain syndrome, mostly over the left hip.  Discussed treatment recommendations including OTC medications, home exercises, physical therapy, considerations for local injection.  At this time we will proceed with OTC medications and home exercises.  Advised that if he would like referral to physical therapy placed, he can let us know at any time.  Can consider injection in the future if symptoms persist despite conservative measures If not responding as expected, consider imaging

## 2022-07-12 NOTE — Patient Instructions (Signed)

## 2022-08-04 ENCOUNTER — Ambulatory Visit (INDEPENDENT_AMBULATORY_CARE_PROVIDER_SITE_OTHER): Payer: BC Managed Care – PPO | Admitting: Family Medicine

## 2022-08-04 ENCOUNTER — Encounter (HOSPITAL_BASED_OUTPATIENT_CLINIC_OR_DEPARTMENT_OTHER): Payer: Self-pay | Admitting: Family Medicine

## 2022-08-04 VITALS — BP 128/88 | HR 74 | Temp 98.3°F | Ht 72.0 in | Wt 188.2 lb

## 2022-08-04 DIAGNOSIS — J069 Acute upper respiratory infection, unspecified: Secondary | ICD-10-CM | POA: Insufficient documentation

## 2022-08-04 LAB — POC INFLUENZA A&B (BINAX/QUICKVUE)
Influenza A, POC: NEGATIVE
Influenza B, POC: NEGATIVE

## 2022-08-04 LAB — POC SOFIA SARS ANTIGEN FIA: SARS Coronavirus 2 Ag: NEGATIVE

## 2022-08-04 MED ORDER — BENZONATATE 200 MG PO CAPS: 200.0000 mg | ORAL_CAPSULE | Freq: Three times a day (TID) | ORAL | 0 refills | Status: DC | PRN

## 2022-08-04 NOTE — Progress Notes (Signed)
    Procedures performed today:    None.  Independent interpretation of notes and tests performed by another provider:   None.  Brief History, Exam, Impression, and Recommendations:    BP 128/88 (BP Location: Left Arm, Patient Position: Sitting, Cuff Size: Large)   Pulse 74   Temp 98.3 F (36.8 C) (Oral)   Ht 6' (1.829 m)   Wt 188 lb 3.2 oz (85.4 kg)   SpO2 99%   BMI 25.52 kg/m   URI (upper respiratory infection) Patient presents for evaluation of upper respiratory symptoms.  He reports that about 4 days ago, he began to experience cough, occasional wheezing, fatigue.  Cough has been intermittently productive.  He has been using some over-the-counter medications to help with symptoms.  Not necessarily aware of any specific sick contacts.  Has not had any fevers, no reported body aches. On exam, patient is in no acute distress, vital signs stable, patient is afebrile.  Cardiovascular exam with regular rate and rhythm, lungs clear to auscultation bilaterally. Suspect acute viral upper respiratory infection.  We did complete both COVID and influenza point-of-care testing which was negative. Recommend continuing with conservative measures to help with controlling symptoms.  Prescription sent to pharmacy for [facility] for patient to utilize to help with symptom control.  Discussed use of honey, tea.  Recommend ensuring adequate rest and hydration.  Would expect gradual improvement in symptoms over the coming 7 to 10 days.  Did discuss that in some cases, cough may linger for up to 4 to 6 weeks despite other symptoms improving.  Return if symptoms worsen or fail to improve.   ___________________________________________ Aviv Rota [name], MD, ABFM, [facility] Primary Care and Sports Medicine [facility]

## 2022-08-05 NOTE — Assessment & Plan Note (Signed)
Patient presents for evaluation of upper respiratory symptoms.  He reports that about 4 days ago, he began to experience cough, occasional wheezing, fatigue.  Cough has been intermittently productive.  He has been using some over-the-counter medications to help with symptoms.  Not necessarily aware of any specific sick contacts.  Has not had any fevers, no reported body aches. On exam, patient is in no acute distress, vital signs stable, patient is afebrile.  Cardiovascular exam with regular rate and rhythm, lungs clear to auscultation bilaterally. Suspect acute viral upper respiratory infection.  We did complete both COVID and influenza point-of-care testing which was negative. Recommend continuing with conservative measures to help with controlling symptoms.  Prescription sent to pharmacy for [facility] for patient to utilize to help with symptom control.  Discussed use of honey, tea.  Recommend ensuring adequate rest and hydration.  Would expect gradual improvement in symptoms over the coming 7 to 10 days.  Did discuss that in some cases, cough may linger for up to 4 to 6 weeks despite other symptoms improving.

## 2022-09-05 ENCOUNTER — Encounter (HOSPITAL_BASED_OUTPATIENT_CLINIC_OR_DEPARTMENT_OTHER): Payer: Self-pay | Admitting: Family Medicine

## 2022-09-05 ENCOUNTER — Ambulatory Visit (HOSPITAL_BASED_OUTPATIENT_CLINIC_OR_DEPARTMENT_OTHER): Payer: BC Managed Care – PPO | Admitting: Family Medicine

## 2022-09-05 VITALS — BP 171/98 | HR 69 | Temp 97.6°F | Ht 72.0 in | Wt 200.1 lb

## 2022-09-05 DIAGNOSIS — I1A0 Resistant hypertension: Secondary | ICD-10-CM

## 2022-09-05 DIAGNOSIS — M25572 Pain in left ankle and joints of left foot: Secondary | ICD-10-CM | POA: Diagnosis not present

## 2022-09-05 DIAGNOSIS — M25559 Pain in unspecified hip: Secondary | ICD-10-CM

## 2022-09-05 DIAGNOSIS — G8929 Other chronic pain: Secondary | ICD-10-CM | POA: Diagnosis not present

## 2022-09-05 MED ORDER — DOXAZOSIN MESYLATE 2 MG PO TABS: 2.0000 mg | ORAL_TABLET | Freq: Every day | ORAL | 1 refills | Status: DC

## 2022-09-05 NOTE — Patient Instructions (Signed)
Hypertension clinic appointment on 09/27/22 at 0915. Please keep this appointment.

## 2022-09-05 NOTE — Assessment & Plan Note (Addendum)
Blood pressure not at goal.  Denies chest pain, shortness of breath, vision changes, headaches, lower extremity swelling.  Patient was seen by hypertension clinic on 09/07/21.   Never started on doxazosin 2 mg daily as prescribed per Dr. Oval Linsey and did not get labs as ordered. Request this provider reorder this medication today.  Reports he is not following a sodium restricted diet as previously instructed.  Reports that he is taking his amlodipine 10 mg, hydralazine 50 mg, and valsartan 320 mg as prescribed.  He occasionally forgets the third dose of hydralazine. Counseled on risk of uncontrolled hypertension. Encouraged sodium restriction diet, follow-up with hypertension clinic and compliance with medications as prescribed.  Patient will continue amlodipine 10 mg daily, hydralazine 50 mg 3 times daily, valsartan 320 mg daily.  Doxazosin 2 mg daily added today (prescribed by cardiology on 08/2021).  Follow-up appointment scheduled while patient in the clinic for hypertension clinic on February 20 at 9:15 AM for evaluation and effectiveness of doxazosin.  Provider strongly encourage patient keep this appointment with cardiology. Follow-up with PCP in 3 months for HTN.  No refills needed today for chronic medications.

## 2022-09-05 NOTE — Assessment & Plan Note (Signed)
Left hip pain has resolved since last visit.  No further evaluation/treatment.

## 2022-09-05 NOTE — Assessment & Plan Note (Signed)
Patient with chronic left ankle pain, had a hiking accident in the 1980s, has had surgery x 2.  Already established with a different orthopedic, he will make an appointment for follow-up for further evaluation.  Range of motion is slightly decreased, reports pain is manageable, and he is able to ambulate.  Denies any further injury.

## 2022-09-05 NOTE — Progress Notes (Signed)
Established Patient Office Visit  Subjective   Patient ID: Michael Mccoy, male    DOB: May 17, 1966  Age: 57 y.o. MRN: [id number]  Chief Complaint  Patient presents with   Follow-up    Pt here for f/u on hip pain and HTN     HPI Hypertension Medication compliance: reports being given medication by HTN clinic and he never picked it up. Unable to go to HTN clinic due to cost of co-pay. Has not been there for over one year. Reports taking hydralazine TID, amlodipine QD, and valsartan QD. He occasional forgets to take the 3rd dose of hydralazine. Never started the doxazosin 2 mg QD.  Denies chest pain, shortness of breath, lower extremity edema, vision changes, headaches.  Pertinent lab work: last labs 09/07/21. Will get CMP today. Never got labs ordered per cards to assess renal function.  Monitoring: 161/87  Tolerating medication well: tolerating well Continue current medication regimen: will reorder doxazosin 2 mg today  Follow-up: 1 month with HTN clinic, PCP in 3 months.   Left ankle pain, previous surgeries x 2. Pain is above ankle, Broke left ankle in hiking accident in 1980's. Has a pin in ankle. Goes to [facility] currently for foot pain. Will make appointment to have this assessed. Able to walk on it. Pain is manageable for now. Will see ortho soon for evaluation.   Left hip pain resolved since last visit here.   Review of Systems  Constitutional:  Negative for chills and fever.  Eyes:  Negative for blurred vision and double vision.  Respiratory:  Negative for shortness of breath.   Cardiovascular:  Negative for chest pain.  Gastrointestinal:  Negative for abdominal pain, diarrhea, nausea and vomiting.  Musculoskeletal:  Negative for myalgias.  Neurological:  Negative for weakness and headaches.      Objective:     BP (!) 171/98   Pulse 69   Temp 97.6 F (36.4 C) (Oral)   Ht 6' (1.829 m)   Wt 200 lb 1.6 oz (90.8 kg)   SpO2 99%   BMI 27.14 kg/m     Physical Exam Vitals and nursing note reviewed.  Constitutional:      General: He is not in acute distress.    Appearance: Normal appearance. He is normal weight.  Cardiovascular:     Rate and Rhythm: Normal rate and regular rhythm.     Heart sounds: Normal heart sounds.  Pulmonary:     Effort: Pulmonary effort is normal.     Breath sounds: Normal breath sounds.  Musculoskeletal:     Comments: Left hip pain has resolved. Left ankle pain from previous injury. ROM slightly decreased. Swelling not worse since surgery in 1980's.   Skin:    General: Skin is warm and dry.  Neurological:     General: No focal deficit present.     Mental Status: He is alert and oriented to person, place, and time.  Psychiatric:        Mood and Affect: Mood normal.        Behavior: Behavior normal.        Thought Content: Thought content normal.        Judgment: Judgment normal.      No results found for any visits on 09/05/22.  Last metabolic panel Lab Results  Component Value Date   GLUCOSE 99 01/30/2019   NA 138 01/30/2019   K 3.7 01/30/2019   CL 100 01/30/2019   CO2 24 12/20/2013   BUN  24 (H) 01/30/2019   CREATININE 1.20 01/30/2019   GFRNONAA 87 (L) 12/20/2013   CALCIUM 9.6 12/20/2013      The ASCVD Risk score (Arnett DK, et al., 2019) failed to calculate for the following reasons:   Cannot find a previous HDL lab   Cannot find a previous total cholesterol lab    Assessment & Plan:   Problem List Items Addressed This Visit     Resistant hypertension - Primary    Blood pressure not at goal.  Denies chest pain, shortness of breath, vision changes, headaches, lower extremity swelling.  Patient was seen by hypertension clinic on 09/07/21.   Never started on doxazosin 2 mg daily as prescribed per Dr. Oval Linsey and did not get labs as ordered. Request this provider reorder this medication today.  Reports he is not following a sodium restricted diet as previously instructed.  Reports  that he is taking his amlodipine 10 mg, hydralazine 50 mg, and valsartan 320 mg as prescribed.  He occasionally forgets the third dose of hydralazine. Counseled on risk of uncontrolled hypertension. Encouraged sodium restriction diet, follow-up with hypertension clinic and compliance with medications as prescribed.  Patient will continue amlodipine 10 mg daily, hydralazine 50 mg 3 times daily, valsartan 320 mg daily.  Doxazosin 2 mg daily added today (prescribed by cardiology on 08/2021).  Follow-up appointment scheduled while patient in the clinic for hypertension clinic on February 20 at 9:15 AM for evaluation and effectiveness of doxazosin.  Provider strongly encourage patient keep this appointment with cardiology. Follow-up with PCP in 3 months for HTN.  No refills needed today for chronic medications.       Relevant Medications   doxazosin (CARDURA) 2 MG tablet   Other Relevant Orders   Comprehensive metabolic panel   Hip pain    Left hip pain has resolved since last visit.  No further evaluation/treatment.      Chronic pain of left ankle    Patient with chronic left ankle pain, had a hiking accident in the 1980s, has had surgery x 2.  Already established with a different orthopedic, he will make an appointment for follow-up for further evaluation.  Range of motion is slightly decreased, reports pain is manageable, and he is able to ambulate.  Denies any further injury.     Blood pressure not at goal. Non-compliant with previous treatment. He will start doxazosin 2 mg QD today, see hypertension clinic as scheduled on 2/20. Will check CMP today.  Agrees with plan of care discussed.  Questions answered.   Return in about 3 months (around 12/05/2022) for HTN, 09/27/22 at 0915 with hypertension clinic. Marland Kitchen    Chalmers Guest, FNP

## 2022-09-06 ENCOUNTER — Encounter: Payer: Self-pay | Admitting: Family Medicine

## 2022-09-06 LAB — COMPREHENSIVE METABOLIC PANEL
ALT: 22 IU/L (ref 0–44)
AST: 19 IU/L (ref 0–40)
Albumin/Globulin Ratio: 2.4 — ABNORMAL HIGH (ref 1.2–2.2)
Albumin: 4.3 g/dL (ref 3.8–4.9)
Alkaline Phosphatase: 54 IU/L (ref 44–121)
BUN/Creatinine Ratio: 13 (ref 9–20)
BUN: 13 mg/dL (ref 6–24)
Bilirubin Total: 0.4 mg/dL (ref 0.0–1.2)
CO2: 24 mmol/L (ref 20–29)
Calcium: 9.5 mg/dL (ref 8.7–10.2)
Chloride: 99 mmol/L (ref 96–106)
Creatinine, Ser: 0.98 mg/dL (ref 0.76–1.27)
Globulin, Total: 1.8 g/dL (ref 1.5–4.5)
Glucose: 96 mg/dL (ref 70–99)
Potassium: 3.8 mmol/L (ref 3.5–5.2)
Sodium: 140 mmol/L (ref 134–144)
Total Protein: 6.1 g/dL (ref 6.0–8.5)
eGFR: 89 mL/min/{1.73_m2} (ref >59)

## 2022-09-27 ENCOUNTER — Ambulatory Visit (HOSPITAL_BASED_OUTPATIENT_CLINIC_OR_DEPARTMENT_OTHER): Payer: BC Managed Care – PPO | Admitting: Family

## 2022-09-29 ENCOUNTER — Encounter (HOSPITAL_BASED_OUTPATIENT_CLINIC_OR_DEPARTMENT_OTHER): Payer: Self-pay | Admitting: Cardiology

## 2022-09-29 ENCOUNTER — Ambulatory Visit (HOSPITAL_BASED_OUTPATIENT_CLINIC_OR_DEPARTMENT_OTHER): Payer: BC Managed Care – PPO | Admitting: Cardiology

## 2022-09-29 VITALS — BP 174/106 | HR 58 | Ht 73.0 in | Wt 198.8 lb

## 2022-09-29 DIAGNOSIS — G4719 Other hypersomnia: Secondary | ICD-10-CM

## 2022-09-29 DIAGNOSIS — J329 Chronic sinusitis, unspecified: Secondary | ICD-10-CM | POA: Diagnosis not present

## 2022-09-29 DIAGNOSIS — I1A0 Resistant hypertension: Secondary | ICD-10-CM

## 2022-09-29 MED ORDER — AMOXICILLIN-POT CLAVULANATE 500-125 MG PO TABS: 1.0000 | ORAL_TABLET | Freq: Three times a day (TID) | ORAL | 0 refills | Status: AC

## 2022-09-29 MED ORDER — HYDRALAZINE HCL 50 MG PO TABS: 100.0000 mg | ORAL_TABLET | Freq: Two times a day (BID) | ORAL | 1 refills | Status: DC

## 2022-09-29 NOTE — Patient Instructions (Signed)
Medication Instructions:   INCREASE Hydralazine to 100 mg twice daily.  START Amoxicillin---Take 1 tablet three times daily for seven days    Labwork: Your physician recommends that you return for lab work today: TSH, A1C, Lipid    Testing/Procedures: Your physician has requested that you have a renal artery duplex. During this test, an ultrasound is used to evaluate blood flow to the kidneys. Allow one hour for this exam. Do not eat after midnight the day before and avoid carbonated beverages. Take your medications as you usually do.    Follow-Up: Thursday, 10/27/22 at 9:15 AM

## 2022-09-29 NOTE — Progress Notes (Signed)
Advanced Hypertension Clinic Initial Assessment:    Date:  09/29/2022   ID:  Vard, Wayner , MRN [id number]  PCP:  de [place], [name], MD  Cardiologist:  None  Nephrologist:  Referring MD: de [place], [name], MD   CC: Hypertension  History of Present Illness:    Michael Mccoy is a 57 y.o. male with a hx of hypertension, arthritis, nephrolithiasis, depression, suicidal behavior.  Here to follow-up in the Advanced Hypertension Clinic.   ED visit 04/2022 for fall with BP 182/104.  He was on amlodipine, losartan and referred to advanced hypertension clinic.  Saw Dr. Jacelyn Grip 06/2021 with BP 163/92.  Established with Dr. Oval Linsey in the advanced hypertension clinic 09/07/2021.  BP in clinic 167/95 with similar readings at home.  Noted previous heart rate in the 40s while on beta-blockers.  He was walking for exercise.  Hydrochlorothiazide previously discontinued due to working outside and being prone to dehydration.  He also reported being prone to developing kidney stones.  His losartan was transition to valsartan.  Hydralazine 50 mg 3 times daily was added.  Dr. Oval Linsey noted the doxazosin could be considered for additional agent if needed given prostate issues and nocturia.  He saw the pharmacy team 10/07/2021.  He was reminded to have his lab work drawn (CMP, renal and aldosterone, lipid panel).  His amlodipine was moved to evening.  Doxazosin 2 mg was added to his regimen.  He has not been seen in follow-up since that time.  He presents today for follow-up of his blood pressure.  He has been feeling poorly over the last month, citing ongoing sinus pressure, and dry cough.  He is not sure if there is a correlation between starting the Cardura or if this is a result of a possible bacterial infection.  He denies fever/chills, does endorse sinus pain and occasionally coughs up green mucus.  He checks his blood pressure sporadically at home, cannot recall any of the readings.  He  frequently misses doses of his hydralazine due to his busy work schedule.  He typically leaves work at 6 AM and is not home until later in the evening.  He states he has had blood pressure issues since he was in his 79s, citing an issue where he went to see a doctor in the early 56s and his blood pressure was in the [id number] systolics. He denies chest pain, palpitations, dyspnea, pnd, orthopnea, n, v, dizziness, syncope, edema, weight gain, or early satiety.    Previous antihypertensives: HCTZ-prone to dehydration working outside Beta-blocker-bradycardia   Past Medical History:  Diagnosis Date   Anxiety    Arthritis    Chest pressure 09/07/2021   Depression    History of kidney stones    Hypertension    Pure hypercholesterolemia 09/07/2021   Resistant hypertension 09/07/2021   Suicidal behavior 2015    Past Surgical History:  Procedure Laterality Date   ANKLE SURGERY Left    COLONOSCOPY  08/2018   CYSTOSCOPY WITH RETROGRADE PYELOGRAM, URETEROSCOPY AND STENT PLACEMENT Right 01/30/2019   Procedure: CYSTOSCOPY WITH RETROGRADE PYELOGRAM, URETEROSCOPY AND STENT PLACEMENT;  Surgeon: Alexis Frock, MD;  Location: [facility];  Service: Urology;  Laterality: Right;  90 MINS   HOLMIUM LASER APPLICATION Right 0000000   Procedure: HOLMIUM LASER APPLICATION;  Surgeon: Alexis Frock, MD;  Location: [facility];  Service: Urology;  Laterality: Right;   KIDNEY STONE SURGERY     Several    Current Medications:  Current Meds  Medication Sig   amLODipine (NORVASC) 10 MG tablet Take 1 tablet (10 mg total) by mouth daily.   amoxicillin-clavulanate (AUGMENTIN) 500-125 MG tablet Take 1 tablet by mouth 3 (three) times daily for 7 days.   benzonatate (TESSALON) 200 MG capsule Take 1 capsule (200 mg total) by mouth 3 (three) times daily as needed for cough.   doxazosin (CARDURA) 2 MG tablet Take 1 tablet (2 mg total) by mouth daily.   tadalafil (CIALIS) 10 MG tablet Take 1  tablet (10 mg total) by mouth daily as needed.   valsartan (DIOVAN) 320 MG tablet Take 1 tablet (320 mg total) by mouth daily.   [DISCONTINUED] hydrALAZINE (APRESOLINE) 50 MG tablet Take 1 tablet (50 mg total) by mouth 3 (three) times daily.     Allergies:   Patient has no known allergies.   Social History   Socioeconomic History   Marital status: Divorced    Spouse name: Not on file   Number of children: Not on file   Years of education: Not on file   Highest education level: Not on file  Occupational History   Not on file  Tobacco Use   Smoking status: Never   Smokeless tobacco: Never  Vaping Use   Vaping Use: Never used  Substance and Sexual Activity   Alcohol use: Yes   Drug use: Yes    Types: Marijuana    Comment: occ. last time several weeks ago   Sexual activity: Not on file  Other Topics Concern   Not on file  Social History Narrative   Not on file   Social Determinants of Health   Financial Resource Strain: Low Risk  (09/07/2021)   Overall Financial Resource Strain (CARDIA)    Difficulty of Paying Living Expenses: Not hard at all  Food Insecurity: No Food Insecurity (09/07/2021)   Hunger Vital Sign    Worried About Running Out of Food in the Last Year: Never true    [place] in the Last Year: Never true  Transportation Needs: No Transportation Needs (09/07/2021)   PRAPARE - Hydrologist (Medical): No    Lack of Transportation (Non-Medical): No  Physical Activity: Unknown (09/07/2021)   Exercise Vital Sign    Days of Exercise per Week: Not on file    Minutes of Exercise per Session: 0 min  Stress: Not on file  Social Connections: Not on file     Family History: The patient's family history includes Bradycardia in his father and mother; COPD in his father; Heart attack in his father.  ROS:   Please see the history of present illness.    All other systems reviewed and are negative.  EKGs/Labs/Other Studies Reviewed:     EKG:  EKG is ordered today.  The ekg ordered today demonstrates sinus bradycardia, HR 58 bpm.  Recent Labs: 09/05/2022: ALT 22; BUN 13; Creatinine, Ser 0.98; Potassium 3.8; Sodium 140   Recent Lipid Panel No results found for: "CHOL", "TRIG", "HDL", "CHOLHDL", "VLDL", "LDLCALC", "LDLDIRECT"  Physical Exam:   VS:  BP (!) 174/106   Pulse (!) 58   Ht 6' 1"$  (1.854 m)   Wt 198 lb 12.8 oz (90.2 kg)   SpO2 97%   BMI 26.23 kg/m  , BMI Body mass index is 26.23 kg/m. GENERAL:  Well appearing HEENT: Pupils equal round and reactive, fundi not visualized, oral mucosa unremarkable NECK:  No jugular venous distention, waveform within normal limits, carotid upstroke  brisk and symmetric, no bruits, no thyromegaly LYMPHATICS:  No cervical adenopathy LUNGS:  Clear to auscultation bilaterally HEART:  RRR.  PMI not displaced or sustained,S1 and S2 within normal limits, no S3, no S4, no clicks, no rubs,  murmurs ABD:  Flat, positive bowel sounds normal in frequency in pitch, no bruits, no rebound, no guarding, no midline pulsatile mass, no hepatomegaly, no splenomegaly EXT:  2 plus pulses throughout, no edema, no cyanosis no clubbing SKIN:  No rashes no nodules NEURO:  Cranial nerves II through XII grossly intact, motor grossly intact throughout PSYCH:  Cognitively intact, oriented to person place and time   ASSESSMENT/PLAN:    Resistant hypertension -  BP today is elevated 174/106, he is checking his BP sporadically at home but will try to start checking it routinely.  He has felt poorly over the last few weeks, initially thought it might be related to starting Cardura however he has also had a bacterial process occurring.  We discussed stopping the Cardura however he is willing to continue at this time.  If he continues to feel poorly after he finishes his course of antibiotics as outlined below may need to stop the Cardura.  Will increase his hydralazine to 100 mg, he will try to take it twice a day  however this has been previously difficult for him.  Continue Cardura, valsartan, amlodipine.  Will order renal ultrasound to rule out RAS.  Will check TSH and A1c.  we did discuss starting Exforge, however he had just recently picked up amlodipine, could consider at future visit.  HLD -no lipid panel on file, he is fasting today, will check FLP.  Recent LFTs were checked 09/05/2022.  If subsequent ASCVD risk is elevated, will plan to start statin.  Sinusitis -tenderness noted over frontal and maxillary sinuses, he reports ongoing facial pressure for greater than a month, with associated cough at times productive. Will give Augmentin 500 - 125 mg three times/day for 7 days. Further follow up would need to be managed by his PCP.   Excessive daytime sleepiness -STOP-BANG score of 4, he does not want to proceed with OSA testing at this time, secondary to insurance coverage.  Will consider at next visit.   Screening for Secondary Hypertension:     09/07/2021    8:26 AM  Causes  Drugs/Herbals Screened     - Comments limiting caffeine, no tobacco.  struggles with diet.  Renovascular HTN Not Screened     - Comments Renal artery Dopplers were appropriately ordered but he is unable to afford them at this time.  Sleep Apnea Screened     - Comments no sympotms  Thyroid Disease Screened     - Comments 1.1 on 06/2021  Hyperaldosteronism Screened     - Comments Check renal and and aldosterone  Pheochromocytoma Screened     - Comments No symptoms  Cushing's Syndrome Screened     - Comments No symptoms  Hyperparathyroidism Screened  Coarctation of the Aorta Screened     - Comments BP symmetric  Compliance Screened    Relevant Labs/Studies:    Latest Ref Rng & Units 09/05/2022    8:58 AM 01/30/2019   11:16 AM 12/20/2013    6:30 PM  Basic Labs  Sodium 134 - 144 mmol/L 140  138  141   Potassium 3.5 - 5.2 mmol/L 3.8  3.7  3.5   Creatinine 0.76 - 1.27 mg/dL 0.98  1.20  1.00  09/29/2022   10:01 AM  Renovascular   Renal Artery [place] Completed Yes       Disposition:    Increase hydralazine to 100 mg twice a day.  Renal ultrasound to rule out RAS.  Check FLP, TSH, A1c.  Consider OSA study at next visit. FU with MD/PharmD in 4 week    Medication Adjustments/Labs and Tests Ordered: Current medicines are reviewed at length with the patient today.  Concerns regarding medicines are outlined above.  Orders Placed This Encounter  Procedures   TSH   Hemoglobin A1c   Lipid panel   EKG 12-Lead   VAS US RENAL ARTERY DUPLEX   Meds ordered this encounter  Medications   amoxicillin-clavulanate (AUGMENTIN) 500-125 MG tablet    Sig: Take 1 tablet by mouth 3 (three) times daily for 7 days.    Dispense:  21 tablet    Refill:  0    Order Specific Question:   Supervising Provider    Answer:   Buford Dresser [id number]   hydrALAZINE (APRESOLINE) 50 MG tablet    Sig: Take 2 tablets (100 mg total) by mouth 2 (two) times daily.    Dispense:  180 tablet    Refill:  1     Signed, Trudi Ida, NP  09/29/2022 11:05 AM    [place]

## 2022-09-30 ENCOUNTER — Encounter (HOSPITAL_BASED_OUTPATIENT_CLINIC_OR_DEPARTMENT_OTHER): Payer: Self-pay

## 2022-09-30 DIAGNOSIS — E782 Mixed hyperlipidemia: Secondary | ICD-10-CM

## 2022-09-30 LAB — LIPID PANEL
Chol/HDL Ratio: 3.4 ratio (ref 0.0–5.0)
Cholesterol, Total: 225 mg/dL — ABNORMAL HIGH (ref 100–199)
HDL: 66 mg/dL (ref >39)
LDL Chol Calc (NIH): 146 mg/dL — ABNORMAL HIGH (ref 0–99)
Triglycerides: 77 mg/dL (ref 0–149)
VLDL Cholesterol Cal: 13 mg/dL (ref 5–40)

## 2022-09-30 LAB — HEMOGLOBIN A1C
Est. average glucose Bld gHb Est-mCnc: 111 mg/dL
Hgb A1c MFr Bld: 5.5 % (ref 4.8–5.6)

## 2022-09-30 LAB — TSH: TSH: 0.723 u[IU]/mL (ref 0.450–4.500)

## 2022-09-30 MED ORDER — ROSUVASTATIN CALCIUM 10 MG PO TABS: 10.0000 mg | ORAL_TABLET | Freq: Every day | ORAL | 1 refills | Status: DC

## 2022-10-19 ENCOUNTER — Encounter (HOSPITAL_BASED_OUTPATIENT_CLINIC_OR_DEPARTMENT_OTHER): Payer: Self-pay

## 2022-10-21 ENCOUNTER — Encounter (HOSPITAL_BASED_OUTPATIENT_CLINIC_OR_DEPARTMENT_OTHER): Payer: BC Managed Care – PPO

## 2022-10-21 ENCOUNTER — Telehealth (HOSPITAL_BASED_OUTPATIENT_CLINIC_OR_DEPARTMENT_OTHER): Payer: Self-pay | Admitting: Family

## 2022-10-21 NOTE — Telephone Encounter (Signed)
Patient was scheduled at 8:00 am for renal doppler here at [facility]--(patient received reminder call that appointment was at 9---will call back to reschedule kf)

## 2022-10-27 ENCOUNTER — Ambulatory Visit (HOSPITAL_BASED_OUTPATIENT_CLINIC_OR_DEPARTMENT_OTHER): Payer: BC Managed Care – PPO | Admitting: Family

## 2022-10-28 ENCOUNTER — Ambulatory Visit (HOSPITAL_COMMUNITY)
Admission: RE | Admit: 2022-10-28 | Discharge: 2022-10-28 | Disposition: A | Discharge status: Home / Self Care | Payer: BC Managed Care – PPO | Source: Ambulatory Visit | Attending: Cardiology | Admitting: Cardiology

## 2022-10-28 DIAGNOSIS — I1A0 Resistant hypertension: Secondary | ICD-10-CM | POA: Diagnosis not present

## 2022-11-09 ENCOUNTER — Other Ambulatory Visit (HOSPITAL_BASED_OUTPATIENT_CLINIC_OR_DEPARTMENT_OTHER): Payer: Self-pay

## 2022-11-09 DIAGNOSIS — I1A0 Resistant hypertension: Secondary | ICD-10-CM

## 2022-11-09 MED ORDER — DOXAZOSIN MESYLATE 2 MG PO TABS: 2.0000 mg | ORAL_TABLET | Freq: Every day | ORAL | 1 refills | Status: DC

## 2022-11-16 ENCOUNTER — Ambulatory Visit (HOSPITAL_BASED_OUTPATIENT_CLINIC_OR_DEPARTMENT_OTHER): Payer: BC Managed Care – PPO | Admitting: Cardiovascular Disease

## 2022-11-16 ENCOUNTER — Encounter (HOSPITAL_BASED_OUTPATIENT_CLINIC_OR_DEPARTMENT_OTHER): Payer: Self-pay | Admitting: Cardiovascular Disease

## 2022-11-16 VITALS — BP 146/76 | HR 61 | Ht 73.0 in | Wt 205.5 lb

## 2022-11-16 DIAGNOSIS — G4730 Sleep apnea, unspecified: Secondary | ICD-10-CM

## 2022-11-16 DIAGNOSIS — I1A0 Resistant hypertension: Secondary | ICD-10-CM | POA: Diagnosis not present

## 2022-11-16 DIAGNOSIS — Z006 Encounter for examination for normal comparison and control in clinical research program: Secondary | ICD-10-CM

## 2022-11-16 MED ORDER — VALSARTAN 320 MG PO TABS: 320.0000 mg | ORAL_TABLET | Freq: Every day | ORAL | 3 refills | Status: DC

## 2022-11-16 MED ORDER — DOXAZOSIN MESYLATE 4 MG PO TABS: 40.0000 mg | ORAL_TABLET | Freq: Every day | ORAL | 3 refills | Status: DC

## 2022-11-16 MED ORDER — DOXAZOSIN MESYLATE 4 MG PO TABS: 4.0000 mg | ORAL_TABLET | Freq: Every day | ORAL | 3 refills | Status: DC

## 2022-11-16 MED ORDER — AMLODIPINE BESYLATE 10 MG PO TABS: 10.0000 mg | ORAL_TABLET | Freq: Every day | ORAL | 3 refills | Status: DC

## 2022-11-16 NOTE — Patient Instructions (Addendum)
Medication Instructions:  INCREASE YOUR DOXAZOSIN TO 4 MG DAILY  IF YOUR BLOOD PRESSURE IS NOT CONSISTENTLY BELOW 140/80 AFTER 2 WEEKS OK TO INCREASE TO 8 MG DAILY   Labwork: CATACHOLAMINES/METANEPHRINES/RENIN/ALDOSTERONE SOON  NEED TO GO FIRST THING IN [facility] MORNING   Testing/Procedures: NONE   Follow-Up: 01/12/2023 9:45 am with Dr Duke Salvia   Any Other Special Instructions Will Be Listed Below (If Applicable). CONTINUE TO MONITOR BLOOD PRESSURE AT HOME. BRING READINGS AND MACHINE TO FOLLOW UP   If you need a refill on your cardiac medications before your next appointment, please call your pharmacy.

## 2022-11-16 NOTE — Research (Signed)
  Subject Name: Michael Mccoy met inclusion and exclusion criteria for the Virtual Care and Social Determinant Interventions for the management of hypertension trial.  The informed consent form, study requirements and expectations were reviewed with the subject by Dr. Duke Salvia and myself. The subject was given the opportunity to read the consent and ask questions. The subject verbalized understanding of the trial requirements.  All questions were addressed prior to the signing of the consent form. The subject agreed to participate in the trial and signed the informed consent. The informed consent was obtained prior to performance of any protocol-specific procedures for the subject.  A copy of the signed informed consent was given to the subject and a copy was placed in the subject's medical record.  Michael Mccoy was randomized to Group 1.   Pt withdrew from the study on 11/16/2022 due to the cost of his $134 co-pay.

## 2022-11-16 NOTE — Assessment & Plan Note (Addendum)
His blood pressure remains uncontrolled.  He will come back to have renal and, aldosterone, catecholamines, and metanephrines assessed in the morning.  We will continue with his current doses of amlodipine, hydralazine, and valsartan.  Increase doxazosin to 4 mg.  If blood pressures remain greater than 140/90 in 2 weeks we will increase it to 8 mg.  He was given information about renal denervation and we will consider this as well.  He is not a candidate for diuretics due to dehydration and near syncope.  Heart rate is too low for beta-blockers.

## 2022-11-16 NOTE — Progress Notes (Signed)
Advanced Hypertension Clinic Follow-up:    Date:  11/18/2022   ID:  Michael BlankJamie E Mccoy, DOB August 14, 1965, MRN [id number]  PCP:  de [place]uba, [name], MD  Cardiologist:  Chilton Siiffany Copperton, MD  Nephrologist:  Referring MD: de [place]uba, [name], MD   CC: Hypertension  History of Present Illness:    Michael Mccoy is a 57 y.o. male with a hx of hypertension, arthritis, nephrolithiasis, depression, and suicidal behavior, here for follow-up. He was initially seen 09/07/2021 in the [facility]. He was in the ER 04/2021 for a fall. His blood pressure was 182/104. He was on amlodipine and losartan, and he was referred to [facility]pert[facility]. He saw Dr. Modesto CharonWong on 06/2021 and his BP was 163/92.   At his initial appointment, he complained of non-exertional chest pressure in the setting of stress and anxiety. He reported issues with high blood pressure since 1991. At that time his BP had spiked to the 200s systolic and he passed out, falling to the floor. His home blood pressures were similar to his clinical reading 167/95. He noted that he was prone to dehydration as his work kept him outside in the heat, therefore his HCTZ was previously discontinued. He also had bradycardia while on beta blockers. We switched losartan to valsartan 320 mg daily, and added hydralazine 50 mg TID. Renin and aldosterone were ordered but not completed. He was enrolled in our RPM study. On 10/07/21 he saw our pharmacist and his BP was 153/87. Readings through [facility] were sporadic and ranged from 158/92 to 189/105. He was started on doxazosin 2 mg daily to assist with hypertension and BPH symptoms. He was advised that he could change his amlodipine to nighttime administration since he had been taking all of his medications in the AM. He was seen by Wallis BambergJennifer Woody, NP 09/2022 and was feeling poorly with sinus pressure and dry cough for 1 month. He thought his symptoms may be related to starting Cardura. His BP in the  office was 174/106. He was not monitoring his BP at home consistently and he frequently missed doses of hydralazine. His hydralazine was increased to 100 mg; he would try to take it twice daily. LDL was 146 as of 09/2022 and he was started on rosuvastatin 10 mg daily. 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  Today, he reports his blood pressure remains elevated. He also has difficulty with checking his BP consistently due to his schedule. He confirms that he is taking 4 antihypertensive agents, including the doxazosin most recently. If he forgets to take them at night, he will take them the following morning without much noticeable difference. On 10/22/2022, he had one episode with his blood pressure spiking to 177/106, causing him to feel weird at the time. Since his last visit he has gained about 12 lbs. He denies any significant lifestyle changes. He continues to work in [employer] and is frequently active. At home he cooks some of his meals. For lunch he may eat peanut butter and jelly sandwiches, roasted [place]urkey, or honey ham. Occasionally he has rotisserie chicken. He denies any palpitations, chest pain, shortness of breath, or peripheral edema. No lightheadedness, headaches, syncope, orthopnea, or PND.  Previous antihypertensives: HCTZ - prone to dehydration working outside [employer]  Past Medical History:  Diagnosis Date   Anxiety    Arthritis    Chest pressure 09/07/2021   Depression  History of kidney stones    Hypertension    Pure hypercholesterolemia 09/07/2021   Resistant hypertension 09/07/2021   Suicidal behavior 2015    Past Surgical History:  Procedure Laterality Date   ANKLE SURGERY Left    COLONOSCOPY  08/2018   CYSTOSCOPY WITH RETROGRADE PYELOGRAM, URETEROSCOPY AND STENT PLACEMENT Right 01/30/2019   Procedure: CYSTOSCOPY WITH RETROGRADE PYELOGRAM, URETEROSCOPY AND STENT PLACEMENT;   Surgeon: Sebastian Ache, MD;  Location: [facility];  Service: Urology;  Laterality: Right;  90 MINS   HOLMIUM LASER APPLICATION Right 01/30/2019   Procedure: HOLMIUM LASER APPLICATION;  Surgeon: Sebastian Ache, MD;  Location: [facility];  Service: Urology;  Laterality: Right;   KIDNEY STONE SURGERY     Several    Current Medications: Current Meds  Medication Sig   hydrALAZINE (APRESOLINE) 50 MG tablet Take 2 tablets (100 mg total) by mouth 2 (two) times daily.   rosuvastatin (CRESTOR) 10 MG tablet Take 1 tablet (10 mg total) by mouth daily.   [DISCONTINUED] amLODipine (NORVASC) 10 MG tablet Take 1 tablet (10 mg total) by mouth daily.   [DISCONTINUED] doxazosin (CARDURA) 2 MG tablet Take 1 tablet (2 mg total) by mouth daily.   [DISCONTINUED] valsartan (DIOVAN) 320 MG tablet Take 1 tablet (320 mg total) by mouth daily.     Allergies:   Patient has no known allergies.   Social History   Socioeconomic History   Marital status: Divorced    Spouse name: Not on file   Number of children: Not on file   Years of education: Not on file   Highest education level: Not on file  Occupational History   Not on file  Tobacco Use   Smoking status: Never   Smokeless tobacco: Never  Vaping Use   Vaping Use: Never used  Substance and Sexual Activity   Alcohol use: Yes   Drug use: Yes    Types: Marijuana    Comment: occ. last time several weeks ago   Sexual activity: Not on file  Other Topics Concern   Not on file  Social History Narrative   Not on file   Social Determinants of Health   Financial Resource Strain: Low Risk  (09/07/2021)   Overall Financial Resource Strain (CARDIA)    Difficulty of Paying Living Expenses: Not hard at all  Food Insecurity: No Food Insecurity (09/07/2021)   Hunger Vital Sign    Worried About Running Out of Food in the Last Year: Never true    Ran Out of Food in the Last Year: Never true  Transportation Needs: No  Transportation Needs (09/07/2021)   PRAPARE - Administrator, Civil Service (Medical): No    Lack of Transportation (Non-Medical): No  Physical Activity: Unknown (09/07/2021)   Exercise Vital Sign    Days of Exercise per Week: Not on file    Minutes of Exercise per Session: 0 min  Stress: Not on file  Social Connections: Not on file     Family History: The patient's family history includes Bradycardia in his father and mother; COPD in his father; Heart attack in his father.  ROS:   Please see the history of present illness.    All other systems reviewed and are negative.  EKGs/Labs/Other Studies Reviewed:    Bilateral Renal Artery Doppler  10/28/2022: Summary:  Renal:    Right: No evidence of right renal artery stenosis. RRV flow present.         Normal size right  kidney. Normal right Resisitive Index.         Normal cortical thickness of right kidney.  Left:  No evidence of left renal artery stenosis. LRV flow present.         Normal size of left kidney. Normal left Resistive Index.         Normal cortical thickness of the left kidney.  Mesenteric:  Normal Celiac artery findings. 70 to 99% stenosis in the superior  Mesenteric artery.   EKG:  EKG is personally reviewed. 11/16/2022:  EKG was not ordered. 09/07/2021: EKG was not ordered.   Recent Labs: 09/05/2022: ALT 22; BUN 13; Creatinine, Ser 0.98; Potassium 3.8; Sodium 140 09/29/2022: TSH 0.723   Recent Lipid Panel    Component Value Date/Time   CHOL 225 (H) 09/29/2022 1011   TRIG 77 09/29/2022 1011   HDL 66 09/29/2022 1011   CHOLHDL 3.4 09/29/2022 1011   LDLCALC 146 (H) 09/29/2022 1011    Physical Exam:    VS:  BP (!) 146/76 (BP Location: Left Arm, Patient Position: Sitting, Cuff Size: Normal)   Pulse 61   Ht 6\' 1"  (1.854 m)   Wt 205 lb 8 oz (93.2 kg)   BMI 27.11 kg/m  , BMI Body mass index is 27.11 kg/m. GENERAL:  Well appearing HEENT: Pupils equal round and reactive, fundi not visualized, oral  mucosa unremarkable NECK:  No jugular venous distention, waveform within normal limits, carotid upstroke brisk and symmetric, no bruits, no thyromegaly LUNGS:  Clear to auscultation bilaterally HEART:  RRR.  PMI not displaced or sustained,S1 and S2 within normal limits, no S3, no S4, no clicks, no rubs, no murmurs ABD:  Flat, positive bowel sounds normal in frequency in pitch, no bruits, no rebound, no guarding, no midline pulsatile mass, no hepatomegaly, no splenomegaly EXT:  2 plus pulses throughout, no edema, no cyanosis no clubbing SKIN:  No rashes no nodules NEURO:  Cranial nerves II through XII grossly intact, motor grossly intact throughout PSYCH:  Cognitively intact, oriented to person place and time   ASSESSMENT/PLAN:    Resistant hypertension His blood pressure remains uncontrolled.  He will come back to have renal and, aldosterone, catecholamines, and metanephrines assessed in the morning.  We will continue with his current doses of amlodipine, hydralazine, and valsartan.  Increase doxazosin to 4 mg.  If blood pressures remain greater than 140/90 in 2 weeks we will increase it to 8 mg.  He was given information about renal denervation and we will consider this as well.  He is not a candidate for diuretics due to dehydration and near syncope.  Heart rate is too low for beta-blockers.  Sleep apnea Encourage CPAP use.   Screening for Secondary Hypertension:     09/07/2021    8:26 AM  Causes  Drugs/Herbals Screened     - Comments limiting caffeine, no tobacco.  struggles with diet.  Renovascular HTN Not Screened     - Comments Renal artery Dopplers were appropriately ordered but he is unable to afford them at this time.  Sleep Apnea Screened     - Comments no sympotms  Thyroid Disease Screened     - Comments 1.1 on 06/2021  Hyperaldosteronism Screened     - Comments Check renal and and aldosterone  Pheochromocytoma Screened     - Comments No symptoms  Cushing's Syndrome  Screened     - Comments No symptoms  Hyperparathyroidism Screened  Coarctation of the Aorta Screened     -  Comments BP symmetric  Compliance Screened    Relevant Labs/Studies:    Latest Ref Rng & Units 09/05/2022    8:58 AM 01/30/2019   11:16 AM 12/20/2013    6:30 PM  Basic Labs  Sodium 134 - 144 mmol/L 140  138  141   Potassium 3.5 - 5.2 mmol/L 3.8  3.7  3.5   Creatinine 0.76 - 1.27 mg/dL 1.61  0.96  0.45      Disposition:    FU with APP/PharmD in 1 month.   FU with [facility] C. Duke Salvia, MD, [facility] in 4 months.  Medication Adjustments/Labs and Tests Ordered: Current medicines are reviewed at length with the patient today.  Concerns regarding medicines are outlined above.   Orders Placed This Encounter  Procedures   Metanephrines, plasma   Catecholamines, fractionated, plasma   Aldosterone + renin activity w/ ratio   Meds ordered this encounter  Medications   amLODipine (NORVASC) 10 MG tablet    Sig: Take 1 tablet (10 mg total) by mouth daily.    Dispense:  90 tablet    Refill:  3   DISCONTD: doxazosin (CARDURA) 4 MG tablet    Sig: Take 10 tablets (40 mg total) by mouth daily.    Dispense:  90 tablet    Refill:  3   valsartan (DIOVAN) 320 MG tablet    Sig: Take 1 tablet (320 mg total) by mouth daily.    Dispense:  90 tablet    Refill:  3   doxazosin (CARDURA) 4 MG tablet    Sig: Take 1 tablet (4 mg total) by mouth daily.    Dispense:  90 tablet    Refill:  3    D/c previous rx   I,Mathew Stumpf,acting as a scribe for [name], MD.,have documented all relevant documentation on the behalf of [name], MD,as directed by  [name], MD while in the presence of [name], MD.  I, Sheana Bir C. Duke Salvia, MD have reviewed all documentation for this visit.  The documentation of the exam, diagnosis, procedures, and orders on 11/18/2022 are all accurate and complete.  Signed, [name], MD  11/18/2022 9:57 AM    [facility]

## 2022-11-17 ENCOUNTER — Encounter (HOSPITAL_BASED_OUTPATIENT_CLINIC_OR_DEPARTMENT_OTHER): Payer: Self-pay | Admitting: Cardiovascular Disease

## 2022-11-17 NOTE — Telephone Encounter (Signed)
Printed and placed on Dr. Leonides Sake desk

## 2022-11-17 NOTE — Telephone Encounter (Signed)
Doxazosin increased to 4mg  daily at yesterday's appointment. Patient took first dose today and reported the below symptoms. Please advise

## 2022-11-18 ENCOUNTER — Encounter (HOSPITAL_BASED_OUTPATIENT_CLINIC_OR_DEPARTMENT_OTHER): Payer: Self-pay | Admitting: Cardiovascular Disease

## 2022-11-18 NOTE — Assessment & Plan Note (Signed)
Encourage CPAP use. 

## 2023-01-12 ENCOUNTER — Encounter (HOSPITAL_BASED_OUTPATIENT_CLINIC_OR_DEPARTMENT_OTHER): Payer: Self-pay | Admitting: Cardiovascular Disease

## 2023-01-12 ENCOUNTER — Ambulatory Visit (HOSPITAL_BASED_OUTPATIENT_CLINIC_OR_DEPARTMENT_OTHER): Payer: BC Managed Care – PPO | Admitting: Cardiovascular Disease

## 2023-01-12 VITALS — BP 148/76 | HR 55 | Ht 73.0 in | Wt 198.3 lb

## 2023-01-12 DIAGNOSIS — G4730 Sleep apnea, unspecified: Secondary | ICD-10-CM

## 2023-01-12 DIAGNOSIS — I1A0 Resistant hypertension: Secondary | ICD-10-CM

## 2023-01-12 DIAGNOSIS — E7800 Pure hypercholesterolemia, unspecified: Secondary | ICD-10-CM

## 2023-01-12 MED ORDER — HYDRALAZINE HCL 100 MG PO TABS: 100.0000 mg | ORAL_TABLET | Freq: Three times a day (TID) | ORAL | 3 refills | Status: DC

## 2023-01-12 NOTE — Progress Notes (Signed)
Advanced Hypertension Clinic Follow-up:    Date:  01/12/2023   ID:  Michael Mccoy, DOB 1966-03-09, MRN [id number]  PCP:  [name], [name], MD  Cardiologist:  [name], MD  Nephrologist:  Referring MD: [name], [name], MD   CC: Hypertension  History of Present Illness:    Michael Mccoy is a 57 y.o. male with a hx of hypertension, arthritis, nephrolithiasis, depression, and suicidal behavior, here for follow-up. He was initially seen 09/07/2021 in the Advanced Hypertension Clinic. He was in the ER 04/2021 for a fall. His blood pressure was 182/104. He was on amlodipine and losartan, and he was referred to Advanced Hypertension Clinic. He saw Dr. Modesto Charon on 06/2021 and his BP was 163/92.   At his initial appointment, he complained of non-exertional chest pressure in the setting of stress and anxiety. He reported issues with high blood pressure since 1991. At that time his BP had spiked to the 200s systolic and he passed out, falling to the floor. His home blood pressures were similar to his clinical reading 167/95. He noted that he was prone to dehydration as his work kept him outside in the heat, therefore his HCTZ was previously discontinued. He also had bradycardia while on beta blockers. We switched losartan to valsartan 320 mg daily, and added hydralazine 50 mg TID. Renin and aldosterone were ordered but not completed. He was enrolled in our RPM study. On 10/07/21 he saw our pharmacist and his BP was 153/87. Readings through [facility] were sporadic and ranged from 158/92 to 189/105. He was started on doxazosin 2 mg daily to assist with hypertension and BPH symptoms. He was advised that he could change his amlodipine to nighttime administration since he had been taking all of his medications in the AM. He was seen by Michael Bamberg, NP 09/2022 and was feeling poorly with sinus pressure and dry cough for 1 month. He thought his symptoms may be related to starting Cardura. His BP in the  office was 174/106. He was not monitoring his BP at home consistently and he frequently missed doses of hydralazine. His hydralazine was increased to 100 mg; he would try to take it twice daily. LDL was 146 as of 09/2022 and he was started on rosuvastatin 10 mg daily. 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  11/2022 blood pressure remains uncontrolled.  He noted that he sometimes forgets to take and take his night medications.  However he does not notice a difference even when he misses his evening doxazosin.  Doxazosin was increased.  Renal and, aldosterone, catecholamines, and metanephrines were ordered but not yet completed.    Michael Mccoy reports experiencing muscle pain and aches while taking doxazosin and it intensified after adding a statin. He has been relying on over-the-counter pain medication to manage the discomfort. The patient also mentions occasional nausea and heartburn after eating.  Michael Mccoy reports feeling excessively tired, even falling asleep during the visit. His blood pressure at home has been around 148/76, with one episode of significantly low blood pressure causing lightheadedness upon standing. The patient is concerned about potential kidney damage from over-the-counter pain medication use but confirms that he is staying well-hydrated.  Michael Mccoy expresses a desire to lose weight, as he has gained weight since starting his medications. He is seeking advice on dietary changes to help with weight loss. The patient has not received any information regarding  renal artery denervation and insurance coverage, nor has he been contacted about a sleep study.  Previous antihypertensives: HCTZ - prone to dehydration working outside [employer]  Past Medical History:  Diagnosis Date   Anxiety    Arthritis    Chest pressure 09/07/2021   Depression    History of kidney stones     Hypertension    Pure hypercholesterolemia 09/07/2021   Resistant hypertension 09/07/2021   Suicidal behavior 2015    Past Surgical History:  Procedure Laterality Date   ANKLE SURGERY Left    COLONOSCOPY  08/2018   CYSTOSCOPY WITH RETROGRADE PYELOGRAM, URETEROSCOPY AND STENT PLACEMENT Right 01/30/2019   Procedure: CYSTOSCOPY WITH RETROGRADE PYELOGRAM, URETEROSCOPY AND STENT PLACEMENT;  Surgeon: Sebastian Ache, MD;  Location: [facility];  Service: Urology;  Laterality: Right;  90 MINS   HOLMIUM LASER APPLICATION Right 01/30/2019   Procedure: HOLMIUM LASER APPLICATION;  Surgeon: Sebastian Ache, MD;  Location: [facility];  Service: Urology;  Laterality: Right;   KIDNEY STONE SURGERY     Several    Current Medications: Current Meds  Medication Sig   amLODipine (NORVASC) 10 MG tablet Take 1 tablet (10 mg total) by mouth daily.   doxazosin (CARDURA) 4 MG tablet Take 1 tablet (4 mg total) by mouth daily.   tadalafil (CIALIS) 10 MG tablet Take 1 tablet (10 mg total) by mouth daily as needed.   valsartan (DIOVAN) 320 MG tablet Take 1 tablet (320 mg total) by mouth daily.   [DISCONTINUED] hydrALAZINE (APRESOLINE) 50 MG tablet Take 2 tablets (100 mg total) by mouth 2 (two) times daily.   [DISCONTINUED] rosuvastatin (CRESTOR) 10 MG tablet Take 1 tablet (10 mg total) by mouth daily.     Allergies:   Patient has no known allergies.   Social History   Socioeconomic History   Marital status: Divorced    Spouse name: Not on file   Number of children: Not on file   Years of education: Not on file   Highest education level: Not on file  Occupational History   Not on file  Tobacco Use   Smoking status: Never   Smokeless tobacco: Never  Vaping Use   Vaping Use: Never used  Substance and Sexual Activity   Alcohol use: Yes   Drug use: Yes    Types: Marijuana    Comment: occ. last time several weeks ago   Sexual activity: Not on file  Other Topics Concern    Not on file  Social History Narrative   Not on file   Social Determinants of Health   Financial Resource Strain: Low Risk  (09/07/2021)   Overall Financial Resource Strain (CARDIA)    Difficulty of Paying Living Expenses: Not hard at all  Food Insecurity: No Food Insecurity (09/07/2021)   Hunger Vital Sign    Worried About Running Out of Food in the Last Year: Never true    Ran Out of Food in the Last Year: Never true  Transportation Needs: No Transportation Needs (09/07/2021)   PRAPARE - Administrator, Civil Service (Medical): No    Lack of Transportation (Non-Medical): No  Physical Activity: Unknown (09/07/2021)   Exercise Vital Sign    Days of Exercise per Week: Not on file    Minutes of Exercise per Session: 0 min  Stress: Not on file  Social Connections: Not on file     Family History: The patient's family history includes Bradycardia in his father and mother; COPD in  his father; Heart attack in his father.  ROS:   Please see the history of present illness.    All other systems reviewed and are negative.  EKGs/Labs/Other Studies Reviewed:    Bilateral Renal Artery Doppler  10/28/2022: Summary:  Renal:    Right: No evidence of right renal artery stenosis. RRV flow present.         Normal size right kidney. Normal right Resisitive Index.         Normal cortical thickness of right kidney.  Left:  No evidence of left renal artery stenosis. LRV flow present.         Normal size of left kidney. Normal left Resistive Index.         Normal cortical thickness of the left kidney.  Mesenteric:  Normal Celiac artery findings. 70 to 99% stenosis in the superior  Mesenteric artery.   EKG:  EKG is personally reviewed. 11/16/2022:  EKG was not ordered. 09/07/2021: EKG was not ordered.   Recent Labs: 09/05/2022: ALT 22; BUN 13; Creatinine, Ser 0.98; Potassium 3.8; Sodium 140 09/29/2022: TSH 0.723   Recent Lipid Panel    Component Value Date/Time   CHOL 225 (H)  09/29/2022 1011   TRIG 77 09/29/2022 1011   HDL 66 09/29/2022 1011   CHOLHDL 3.4 09/29/2022 1011   LDLCALC 146 (H) 09/29/2022 1011    Physical Exam:    VS:  BP (!) 148/76 (BP Location: Left Arm, Patient Position: Sitting, Cuff Size: Normal)   Pulse (!) 55   Ht 6\' 1"  (1.854 m)   Wt 198 lb 4.8 oz (89.9 kg)   SpO2 98%   BMI 26.16 kg/m  , BMI Body mass index is 26.16 kg/m. GENERAL:  Well appearing HEENT: Pupils equal round and reactive, fundi not visualized, oral mucosa unremarkable NECK:  No jugular venous distention, waveform within normal limits, carotid upstroke brisk and symmetric, no bruits, no thyromegaly LUNGS:  Clear to auscultation bilaterally HEART:  RRR.  PMI not displaced or sustained,S1 and S2 within normal limits, no S3, no S4, no clicks, no rubs, no murmurs ABD:  Flat, positive bowel sounds normal in frequency in pitch, no bruits, no rebound, no guarding, no midline pulsatile mass, no hepatomegaly, no splenomegaly EXT:  2 plus pulses throughout, no edema, no cyanosis no clubbing SKIN:  No rashes no nodules NEURO:  Cranial nerves II through XII grossly intact, motor grossly intact throughout PSYCH:  Cognitively intact, oriented to person place and time   ASSESSMENT/PLAN:    # Hypertension: - Continue amlodipine and valsartan at the current dose - Continue doxazosin 4 mg - Increase hydralazine to 100mg  tid - Check renin and aldosterone levels - Investigate renal artery denervation procedure, including insurance coverage and costs.  Will proceed if it is an option for him financially  # Muscle pain and possible statin side effects: # Hyperlipidemia: - Discontinue rosuvastatin - Monitor for improvement in muscle aches after discontinuation -Check fasting lipids and CMP  # Sleep apnea: - Order Itamar home sleep study - Consider consultation with Dr. Annalee Genta (ENT) for Inspire device if sleep apnea is confirmed - Explore oral airway device options with a dentist  if needed  # Weight management: - Encourage whole grain carbohydrates and protein intake - Recommend drinking mostly water for hydration - Monitor weight changes and discuss further dietary modifications if needed -Continue with regular exercise.  Follow-up in three months to assess progress and make any necessary adjustments to the treatment plan.   Screening  for Secondary Hypertension:     09/07/2021    8:26 AM  Causes  Drugs/Herbals Screened     - Comments limiting caffeine, no tobacco.  struggles with diet.  Renovascular HTN Not Screened     - Comments Renal artery Dopplers were appropriately ordered but he is unable to afford them at this time.  Sleep Apnea Screened     - Comments no sympotms  Thyroid Disease Screened     - Comments 1.1 on 06/2021  Hyperaldosteronism Screened     - Comments Check renal and and aldosterone  Pheochromocytoma Screened     - Comments No symptoms  Cushing's Syndrome Screened     - Comments No symptoms  Hyperparathyroidism Screened  Coarctation of the Aorta Screened     - Comments BP symmetric  Compliance Screened    Relevant Labs/Studies:    Latest Ref Rng & Units 09/05/2022    8:58 AM 01/30/2019   11:16 AM 12/20/2013    6:30 PM  Basic Labs  Sodium 134 - 144 mmol/L 140  138  141   Potassium 3.5 - 5.2 mmol/L 3.8  3.7  3.5   Creatinine 0.76 - 1.27 mg/dL 1.61  0.96  0.45      Disposition:    FU with Garnell Begeman C. Duke Salvia, MD, [facility] in 3 months.  Medication Adjustments/Labs and Tests Ordered: Current medicines are reviewed at length with the patient today.  Concerns regarding medicines are outlined above.   Orders Placed This Encounter  Procedures   Lipid panel   Comprehensive metabolic panel   Aldosterone + renin activity w/ ratio   Itamar Sleep Study   Meds ordered this encounter  Medications   hydrALAZINE (APRESOLINE) 100 MG tablet    Sig: Take 1 tablet (100 mg total) by mouth 3 (three) times daily.    Dispense:  270 tablet     Refill:  3    NEW DOSE, PATIENT WILL CALL WHEN NEEDS FILLED    Signed, [name], MD  01/12/2023 12:52 PM    [facility]

## 2023-01-12 NOTE — Patient Instructions (Addendum)
Medication Instructions:  STOP ROSUVASTATIN   INCREASE HYDRALAZINE TO THREE TIMES A DAY   Labwork: LP/CMBET/RENIN/ALDOSTERONE TODAY   Testing/Procedures: RENAL DENERVATION, WILL CALL YOU WITH DATE AND TIME   ITAMAR HOME SLEEP STUDY  THE OFFICE WILL CALL YOU WITH CODE   Follow-Up: DR [facility] 04/05/2023 8:45 AM   Any Other Special Instructions Will Be Listed Below (If Applicable).  WatchPAT?  Is a FDA cleared portable home sleep study test that uses a watch and 3 points of contact to monitor 7 different channels, including your heart rate, oxygen saturations, body position, snoring, and chest motion.  The study is easy to use from the comfort of your own home and accurately detect sleep apnea.  Before bed, you attach the chest sensor, attached the sleep apnea bracelet to your nondominant hand, and attach the finger probe.  After the study, the raw data is downloaded from the watch and scored for apnea events.   For more information: [email or web address]  Patient Testing Instructions:  Do not put battery into the device until bedtime when you are ready to begin the test. Please call the support number if you need assistance after following the instructions below: 24 hour support line- [phone] or ITAMAR support at [phone] (option 2)  Download the IntelWatchPAT One" app through the google play store or App Store  Be sure to turn on or enable access to bluetooth in settlings on your smartphone/ device  Make sure no other bluetooth devices are on and within the vicinity of your smartphone/ device and WatchPAT watch during testing.  Make sure to leave your smart phone/ device plugged in and charging all night.  When ready for bed:  Follow the instructions step by step in the WatchPAT One App to activate the testing device. For additional instructions, including video instruction, visit the WatchPAT One video on Youtube. You can search for WatchPat One within  Youtube (video is 4 minutes and 18 seconds) or enter: https://youtube/watch?v=BCce_vbiwxE Please note: You will be prompted to enter a Pin to connect via bluetooth when starting the test. The PIN will be assigned to you when you receive the test.  The device is disposable, but it recommended that you retain the device until you receive a call letting you know the study has been received and the results have been interpreted.  We will let you know if the study did not transmit to [place] properly after the test is completed. You do not need to call us to confirm the receipt of the test.  Please complete the test within 48 hours of receiving PIN.   Frequently Asked Questions:  What is Watch Dennie Bible one?  A single use fully disposable home sleep apnea testing device and will not need to be returned after completion.  What are the requirements to use WatchPAT one?  The be able to have a successful watchpat one sleep study, you should have your Watch pat one device, your smart phone, watch pat one app, your PIN number and Internet access What type of phone do I need?  You should have a smart phone that uses Android 5.1 and above or any Iphone with IOS 10 and above How can I download the WatchPAT one app?  Based on your device type search for WatchPAT one app either in google play for android devices or APP store for Iphone's Where will I get my PIN for the study?  Your PIN will be provided by your physician's office. It is used for  authentication and if you lose/forget your PIN, please reach out to your providers office.  I do not have Internet at home. Can I do WatchPAT one study?  WatchPAT One needs Internet connection throughout the night to be able to transmit the sleep data. You can use your home/local internet or your cellular's data package. However, it is always recommended to use home/local Internet. It is estimated that between 20MB-30MB will be used with each study.However, the application will be  looking for space in the phone to start the study.  What happens if I lose internet or bluetooth connection?  During the internet disconnection, your phone will not be able to transmit the sleep data. All the data, will be stored in your phone. As soon as the internet connection is back on, the phone will being sending the sleep data. During the bluetooth disconnection, WatchPAT one will not be able to to send the sleep data to your phone. Data will be kept in the [facility] one until two devices have bluetooth connection back on. As soon as the connection is back on, WatchPAT one will send the sleep data to the phone.  How long do I need to wear the WatchPAT one?  After you start the study, you should wear the device at least 6 hours.  How far should I keep my phone from the device?  During the night, your phone should be within 15 feet.  What happens if I leave the room for restroom or other reasons?  Leaving the room for any reason will not cause any problem. As soon as your get back to the room, both devices will reconnect and will continue to send the sleep data. Can I use my phone during the sleep study?  Yes, you can use your phone as usual during the study. But it is recommended to put your watchpat one on when you are ready to go to bed.  How will I get my study results?  A soon as you completed your study, your sleep data will be sent to the provider. They will then share the results with you when they are ready.

## 2023-01-18 LAB — LIPID PANEL
Chol/HDL Ratio: 2.1 ratio (ref 0.0–5.0)
Cholesterol, Total: 135 mg/dL (ref 100–199)
HDL: 64 mg/dL (ref >39)
LDL Chol Calc (NIH): 58 mg/dL (ref 0–99)
Triglycerides: 59 mg/dL (ref 0–149)
VLDL Cholesterol Cal: 13 mg/dL (ref 5–40)

## 2023-01-18 LAB — COMPREHENSIVE METABOLIC PANEL
ALT: 26 IU/L (ref 0–44)
AST: 24 IU/L (ref 0–40)
Albumin/Globulin Ratio: 2 (ref 1.2–2.2)
Albumin: 4.5 g/dL (ref 3.8–4.9)
Alkaline Phosphatase: 62 IU/L (ref 44–121)
BUN/Creatinine Ratio: 18 (ref 9–20)
BUN: 19 mg/dL (ref 6–24)
Bilirubin Total: 0.5 mg/dL (ref 0.0–1.2)
CO2: 25 mmol/L (ref 20–29)
Calcium: 9.6 mg/dL (ref 8.7–10.2)
Chloride: 101 mmol/L (ref 96–106)
Creatinine, Ser: 1.07 mg/dL (ref 0.76–1.27)
Globulin, Total: 2.2 g/dL (ref 1.5–4.5)
Glucose: 104 mg/dL — ABNORMAL HIGH (ref 70–99)
Potassium: 4.6 mmol/L (ref 3.5–5.2)
Sodium: 141 mmol/L (ref 134–144)
Total Protein: 6.7 g/dL (ref 6.0–8.5)
eGFR: 80 mL/min/{1.73_m2} (ref >59)

## 2023-01-18 LAB — ALDOSTERONE + RENIN ACTIVITY W/ RATIO
Aldos/Renin Ratio: 4.4 (ref 0.0–30.0)
Aldosterone: 5 ng/dL (ref 0.0–30.0)
Renin Activity, Plasma: 1.149 ng/mL/hr (ref 0.167–5.380)

## 2023-02-01 ENCOUNTER — Telehealth: Payer: Self-pay | Admitting: *Deleted

## 2023-02-01 DIAGNOSIS — G4730 Sleep apnea, unspecified: Secondary | ICD-10-CM

## 2023-02-01 DIAGNOSIS — G4719 Other hypersomnia: Secondary | ICD-10-CM

## 2023-02-01 DIAGNOSIS — I1A0 Resistant hypertension: Secondary | ICD-10-CM

## 2023-02-01 NOTE — Telephone Encounter (Signed)
Itamar ordered by Dr [name]

## 2023-02-17 ENCOUNTER — Encounter: Payer: Self-pay | Admitting: *Deleted

## 2023-02-17 ENCOUNTER — Telehealth: Payer: Self-pay | Admitting: *Deleted

## 2023-02-17 NOTE — Telephone Encounter (Signed)
LVM to offer colon cancer screening. Mychart message sent

## 2023-03-24 ENCOUNTER — Telehealth: Payer: Self-pay | Admitting: *Deleted

## 2023-03-24 NOTE — Telephone Encounter (Signed)
Left a message for the patient to call back to discuss the Renal Denervation Procedure.

## 2023-03-31 ENCOUNTER — Other Ambulatory Visit (HOSPITAL_BASED_OUTPATIENT_CLINIC_OR_DEPARTMENT_OTHER): Payer: Self-pay | Admitting: Family

## 2023-03-31 DIAGNOSIS — E782 Mixed hyperlipidemia: Secondary | ICD-10-CM

## 2023-04-04 NOTE — Telephone Encounter (Signed)
Left a message for the patient to call back.  

## 2023-04-05 ENCOUNTER — Encounter (HOSPITAL_BASED_OUTPATIENT_CLINIC_OR_DEPARTMENT_OTHER): Payer: Self-pay | Admitting: Cardiovascular Disease

## 2023-04-05 ENCOUNTER — Ambulatory Visit (HOSPITAL_BASED_OUTPATIENT_CLINIC_OR_DEPARTMENT_OTHER): Payer: BC Managed Care – PPO | Admitting: Cardiovascular Disease

## 2023-04-05 VITALS — BP 152/78 | HR 63 | Ht 73.0 in | Wt 197.2 lb

## 2023-04-05 DIAGNOSIS — E7800 Pure hypercholesterolemia, unspecified: Secondary | ICD-10-CM

## 2023-04-05 DIAGNOSIS — G4730 Sleep apnea, unspecified: Secondary | ICD-10-CM

## 2023-04-05 DIAGNOSIS — I1A0 Resistant hypertension: Secondary | ICD-10-CM | POA: Diagnosis not present

## 2023-04-05 MED ORDER — DOXAZOSIN MESYLATE 4 MG PO TABS: 4.0000 mg | ORAL_TABLET | Freq: Every day | ORAL | 3 refills | Status: DC

## 2023-04-05 NOTE — Patient Instructions (Addendum)
Medication Instructions:  REFILL FOR DOXAZOSIN SENT TO YOUR PHARMACY   Labwork: NONE  Testing/Procedures: WILL FOLLOW UP ON THE SLEEP STUDY  Follow-Up: 3-4 MONTHS IN ADV HTN CLINIC   You have been referred to PREP ([facility]) PROGRAM   WILL HAVE SOMEONE REACH OUT TO YOU REGARDING THE RENAL DENERVATION  CODES SO YOU CAN CALL YOUR INSURANCE FOR APPROXIMATE COST   If you need a refill on your cardiac medications before your next appointment, please call your pharmacy.

## 2023-04-05 NOTE — Progress Notes (Signed)
Advanced Hypertension Clinic Follow-up:    Date:  04/10/2023   ID:  GID BADIA, DOB 10/19/1965, MRN [id number]  PCP:  [name], [name], MD  Cardiologist:  [name], MD  Nephrologist:  Referring MD: [name], [name], MD   CC: Hypertension  History of Present Illness:    Michael Mccoy is a 57 y.o. male with a hx of hypertension, arthritis, nephrolithiasis, depression, and suicidal behavior, here for follow-up. He was initially seen 09/07/2021 in the [facility]. He was in the ER 04/2021 for a fall. His blood pressure was 182/104. He was on amlodipine and losartan, and he was referred to Advanced Hypertension Clinic. He saw Dr. Modesto Charon on 06/2021 and his BP was 163/92.   At his initial appointment, he complained of non-exertional chest pressure in the setting of stress and anxiety. He reported issues with high blood pressure since 1991. At that time his BP had spiked to the 200s systolic and he passed out, falling to the floor. His home blood pressures were similar to his clinical reading 167/95. He noted that he was prone to dehydration as his work kept him outside in the heat, therefore his HCTZ was previously discontinued. He also had bradycardia while on beta blockers. We switched losartan to valsartan 320 mg daily, and added hydralazine 50 mg TID. Renin and aldosterone were ordered but not completed. He was enrolled in our RPM study. On 10/07/21 he saw our pharmacist and his BP was 153/87. Readings through [facility] were sporadic and ranged from 158/92 to 189/105. He was started on doxazosin 2 mg daily to assist with hypertension and BPH symptoms. He was advised that he could change his amlodipine to nighttime administration since he had been taking all of his medications in the AM. He was seen by Wallis Bamberg, NP 09/2022 and was feeling poorly with sinus pressure and dry cough for 1 month. He thought his symptoms may be related to starting Cardura. His BP in the  office was 174/106. He was not monitoring his BP at home consistently and he frequently missed doses of hydralazine. His hydralazine was increased to 100 mg; he would try to take it twice daily. LDL was 146 as of 09/2022 and he was started on rosuvastatin 10 mg daily. 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 In 11/2022 his blood pressure remained uncontrolled. Sometimes forgot to take his nighttime medications. Doxazosin was increased.    At his visit 01/2023, he complained of myalgias and we discontinued rosuvastatin. He also had excessive fatigue and home blood pressures were averaging 148/76. Hydralazine was increased to 100 mg TID. We also discussed the renal denervation procedure. Renin and aldosterone levels were normal. Normal kidney and liver function. Cholesterol was at goal. He was referred for Itamar sleep study, not completed.  Today, he reports not feeling well. He has had some dizzy spells and physically feels weak. Lately he is experiencing mild muscular cramps of his left-sided back and thigh. No recent injuries. He presents a blood pressure log, more readings from 01/2023 as he has fallen out of the habit of routine monitoring. Average of 130-140 systolic, but also some low blood pressures of 90s-100s associated with dizziness. He states his blood pressure has been fluctuating quite a bit sometimes. At this time he has not taken doxazosin in about a week as he ran out. In the office his blood pressure is 152/83  initially, and 152/78 on manual repeat. He felt mildly dizzy when he first arrived at the office, but not currently. Mostly the dizziness occurs when he is at work; he is usually physically active outside at those times. No significant congestion or seasonal allergies. He usually tries to hydrate adequately; drank 3/4 gallon of water yesterday. Continues to struggle with fatigue. He does not believe that  he snores. He reports that his sleep study was denied. He has been approved for renal denervation but has not received any information about this. He denies any palpitations, chest pain, shortness of breath, peripheral edema, headaches, syncope, orthopnea, or PND.  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   Suicidal behavior 2015    Past Surgical History:  Procedure Laterality Date   ANKLE SURGERY Left    COLONOSCOPY  08/2018   CYSTOSCOPY WITH RETROGRADE PYELOGRAM, URETEROSCOPY AND STENT PLACEMENT Right 01/30/2019   Procedure: CYSTOSCOPY WITH RETROGRADE PYELOGRAM, URETEROSCOPY AND STENT PLACEMENT;  Surgeon: Sebastian Ache, MD;  Location: [facility];  Service: Urology;  Laterality: Right;  90 MINS   HOLMIUM LASER APPLICATION Right 01/30/2019   Procedure: HOLMIUM LASER APPLICATION;  Surgeon: Sebastian Ache, MD;  Location: [facility];  Service: Urology;  Laterality: Right;   KIDNEY STONE SURGERY     Several    Current Medications: Current Meds  Medication Sig   amLODipine (NORVASC) 10 MG tablet Take 1 tablet (10 mg total) by mouth daily.   hydrALAZINE (APRESOLINE) 100 MG tablet Take 1 tablet (100 mg total) by mouth 3 (three) times daily.   tadalafil (CIALIS) 10 MG tablet Take 1 tablet (10 mg total) by mouth daily as needed.   valsartan (DIOVAN) 320 MG tablet Take 1 tablet (320 mg total) by mouth daily.     Allergies:   Patient has no known allergies.   Social History   Socioeconomic History   Marital status: Divorced    Spouse name: Not on file   Number of children: Not on file   Years of education: Not on file   Highest education level: Not on file  Occupational History   Not on  file  Tobacco Use   Smoking status: Never   Smokeless tobacco: Never  Vaping Use   Vaping status: Never Used  Substance and Sexual Activity   Alcohol use: Yes   Drug use: Yes    Types: Marijuana    Comment: occ. last time several weeks ago   Sexual activity: Not on file  Other Topics Concern   Not on file  Social History Narrative   Not on file   Social Determinants of Health   Financial Resource Strain: Low Risk  (09/07/2021)   Overall Financial Resource Strain (CARDIA)    Difficulty of Paying Living Expenses: Not hard at all  Food Insecurity: No Food Insecurity (09/07/2021)   Hunger Vital Sign    Worried About Running Out of Food in the Last Year: Never true    Ran Out of Food in the Last Year: Never true  Transportation Needs: No Transportation Needs (09/07/2021)   PRAPARE - Administrator, Civil Service (Medical): No    Lack of Transportation (Non-Medical): No  Physical Activity:  Unknown (09/07/2021)   Exercise Vital Sign    Days of Exercise per Week: Not on file    Minutes of Exercise per Session: 0 min  Stress: Not on file  Social Connections: Unknown (12/20/2021)   Received from [facility]   Social Network    Social Network: Not on file     Family History: The patient's family history includes Bradycardia in his father and mother; COPD in his father; Heart attack in his father.  ROS:   Please see the history of present illness.    (+) Dizzy spells (+) Generalized weakness (+) Left sided muscle cramps of back and thigh (+) Excessive fatigue (+) Daytime somnolence All other systems reviewed and are negative.  EKGs/Labs/Other Studies Reviewed:    Bilateral Renal Artery Doppler  10/28/2022: Summary:  Renal:    Right: No evidence of right renal artery stenosis. RRV flow present.         Normal size right kidney. Normal right Resisitive Index.         Normal cortical thickness of right kidney.  Left:  No evidence of left renal artery stenosis.  LRV flow present.         Normal size of left kidney. Normal left Resistive Index.         Normal cortical thickness of the left kidney.  Mesenteric:  Normal Celiac artery findings. 70 to 99% stenosis in the superior  Mesenteric artery.   EKG:  EKG is personally reviewed. 04/05/2023:  Not ordered. 11/16/2022:  Not ordered. 09/07/2021:  Not ordered.   Recent Labs: 09/29/2022: TSH 0.723 01/12/2023: ALT 26; BUN 19; Creatinine, Ser 1.07; Potassium 4.6; Sodium 141   Recent Lipid Panel    Component Value Date/Time   CHOL 135 01/12/2023 1037   TRIG 59 01/12/2023 1037   HDL 64 01/12/2023 1037   CHOLHDL 2.1 01/12/2023 1037   LDLCALC 58 01/12/2023 1037    Physical Exam:    VS:  BP (!) 152/78 (BP Location: Left Arm, Patient Position: Sitting, Cuff Size: Normal)   Pulse 63   Ht 6\' 1"  (1.854 m)   Wt 197 lb 3.4 oz (89.5 kg)   SpO2 99%   BMI 26.02 kg/m  , BMI Body mass index is 26.02 kg/m. GENERAL:  Well appearing HEENT: Pupils equal round and reactive, fundi not visualized, oral mucosa unremarkable NECK:  No jugular venous distention, waveform within normal limits, carotid upstroke brisk and symmetric, no bruits, no thyromegaly LUNGS:  Clear to auscultation bilaterally HEART:  RRR.  PMI not displaced or sustained,S1 and S2 within normal limits, no S3, no S4, no clicks, no rubs, no murmurs ABD:  Flat, positive bowel sounds normal in frequency in pitch, no bruits, no rebound, no guarding, no midline pulsatile mass, no hepatomegaly, no splenomegaly EXT:  2 plus pulses throughout, no edema, no cyanosis no clubbing SKIN:  No rashes no nodules NEURO:  Cranial nerves II through XII grossly intact, motor grossly intact throughout PSYCH:  Cognitively intact, oriented to person place and time   ASSESSMENT/PLAN:    # Hypertension Uncontrolled with reported dizziness and weakness. Patient has been non-compliant with doxazosin for the past week due to running out of it. Blood pressure readings  have been fluctuating. Discussed the possibility of renal denervation. -Resume Doxazosin 4mg  daily. -Check blood pressure regularly and maintain a log. -Investigate insurance coverage for renal denervation procedure.  # Muscle Cramps Patient reports muscle cramps, particularly on the left side. Possible dehydration or electrolyte imbalance due  to high heat exposure and physical job. -Encourage hydration and consider over-the-counter magnesium supplement for cramps. -Check magnesium levels.  # Diet and Lifestyle Patient acknowledges poor diet and expresses interest in improving eating habits. -Encourage participation in upcoming healthy cooking class. -Consider referral to the [facility] Prep program for comprehensive lifestyle modification support.  # Sleep Study Patient's insurance denied coverage for sleep study. -Appeal insurance decision for sleep study coverage.  Follow-up in 3-4 months or sooner if symptoms worsen.      Screening for Secondary Hypertension:     09/07/2021    8:26 AM  Causes  Drugs/Herbals Screened     - Comments limiting caffeine, no tobacco.  struggles with diet.  Renovascular HTN Not Screened     - Comments Renal artery Dopplers were appropriately ordered but he is unable to afford them at this time.  Sleep Apnea Screened     - Comments no sympotms  Thyroid Disease Screened     - Comments 1.1 on 06/2021  Hyperaldosteronism Screened     - Comments Check renal and and aldosterone  Pheochromocytoma Screened     - Comments No symptoms  Cushing's Syndrome Screened     - Comments No symptoms  Hyperparathyroidism Screened  Coarctation of the Aorta Screened     - Comments BP symmetric  Compliance Screened    Relevant Labs/Studies:    Latest Ref Rng & Units 01/12/2023   10:37 AM 09/05/2022    8:58 AM 01/30/2019   11:16 AM  Basic Labs  Sodium 134 - 144 mmol/L 141  140  138   Potassium 3.5 - 5.2 mmol/L 4.6  3.8  3.7   Creatinine 0.76 - 1.27 mg/dL 1.61  0.96   0.45      Disposition:    FU with Roshni Burbano C. Duke Salvia, MD, [facility] in 3-4 months.  Medication Adjustments/Labs and Tests Ordered: Current medicines are reviewed at length with the patient today.  Concerns regarding medicines are outlined above.   Orders Placed This Encounter  Procedures   Amb Referral To Provider Referral Exercise Program (P.R.E.P)   Meds ordered this encounter  Medications   doxazosin (CARDURA) 4 MG tablet    Sig: Take 1 tablet (4 mg total) by mouth daily.    Dispense:  90 tablet    Refill:  3   I,Mathew Stumpf,acting as a scribe for [name], MD.,have documented all relevant documentation on the behalf of [name], MD,as directed by  [name], MD while in the presence of [name], MD.  I, Ellery Meroney C. Duke Salvia, MD have reviewed all documentation for this visit.  The documentation of the exam, diagnosis, procedures, and orders on 04/10/2023 are all accurate and complete.   Signed, [name], MD  04/10/2023 2:01 PM    [facility]

## 2023-04-05 NOTE — Telephone Encounter (Signed)
Patient was seen by Dr Duke Salvia for follow up  Renal denervation was discussed, stated he would have to wait until next year to explore this option secondary to busy work schedule/cost of procedure (out of pocket deductible high)  He will discuss at follow up in 3-4 months

## 2023-04-06 ENCOUNTER — Telehealth: Payer: Self-pay

## 2023-04-06 NOTE — Telephone Encounter (Signed)
VMT pt requesting call back to discuss referral to PREP 

## 2023-04-06 NOTE — Telephone Encounter (Signed)
Call back from pt. Explained program. Closest to [employer] and needs an evening class.  Explained likely to be at the end of the year before I have an evening class start.  Will be T/TH [phone] at [place] Has my number for call back.

## 2023-04-10 ENCOUNTER — Encounter (HOSPITAL_BASED_OUTPATIENT_CLINIC_OR_DEPARTMENT_OTHER): Payer: Self-pay | Admitting: Cardiovascular Disease

## 2023-04-24 ENCOUNTER — Telehealth (HOSPITAL_BASED_OUTPATIENT_CLINIC_OR_DEPARTMENT_OTHER): Payer: Self-pay | Admitting: *Deleted

## 2023-04-24 NOTE — Telephone Encounter (Signed)
LVM to schedule an appt for bp check

## 2023-05-12 ENCOUNTER — Other Ambulatory Visit: Payer: Self-pay | Admitting: Family Medicine

## 2023-05-12 DIAGNOSIS — Z1211 Encounter for screening for malignant neoplasm of colon: Secondary | ICD-10-CM

## 2023-05-12 DIAGNOSIS — Z1212 Encounter for screening for malignant neoplasm of rectum: Secondary | ICD-10-CM

## 2023-05-16 ENCOUNTER — Telehealth (HOSPITAL_BASED_OUTPATIENT_CLINIC_OR_DEPARTMENT_OTHER): Payer: Self-pay

## 2023-05-16 NOTE — Telephone Encounter (Addendum)
Hi ladies, following up on this Michael Mccoy, ordered 6/26   ----- Message from Nurse Janit Bern sent at 04/05/2023  9:50 AM EDT ----- Hello all  Just following up on Michael Mccoy that was ordered in June. Patient seen today and Dr Duke Salvia updated her office note   Michael Mccoy

## 2023-06-05 NOTE — Telephone Encounter (Signed)
**Note De-Identified [name] Obfuscation** Ordering provider: Dr Duke Salvia Associated diagnoses: Daytime Somnolence-R40.0 and Fatigue-R53.83 WatchPAT PA obtained on 06/05/2023 by [name], [name], LPN. Order ID: [id number] Authorized: Approval Valid Through:06/05/2023 - 08/03/2023 Patient notified of PIN (1234) on 06/05/2023 [name] Notification Method: MyChart message and [name] a message on his VM.  Phone note routed to covering staff for follow-up.

## 2023-06-23 ENCOUNTER — Telehealth: Payer: Self-pay

## 2023-06-23 NOTE — Telephone Encounter (Signed)
VMT pt requesting call back reference starting PREP on 07/11/23 T/TH [phone]

## 2023-06-27 ENCOUNTER — Telehealth: Payer: Self-pay

## 2023-06-27 NOTE — Telephone Encounter (Signed)
VMT pt requesting call back to confirm interest in PREP.

## 2023-07-04 ENCOUNTER — Encounter (HOSPITAL_BASED_OUTPATIENT_CLINIC_OR_DEPARTMENT_OTHER): Payer: Self-pay | Admitting: *Deleted

## 2023-07-19 ENCOUNTER — Encounter (HOSPITAL_BASED_OUTPATIENT_CLINIC_OR_DEPARTMENT_OTHER): Payer: BC Managed Care – PPO | Admitting: Cardiovascular Disease

## 2023-07-19 NOTE — Progress Notes (Unsigned)
**Note Michael-Identified via Obfuscation** [facility] Follow-up:    Date:  07/19/2023   ID:  Michael Mccoy, DOB 1965/12/20, MRN [id number]  PCP:  Michael Peru, [name], MD  Cardiologist:  [name], MD  Nephrologist:  Referring MD: Michael Peru, [name], MD   CC: Hypertension  History of Present Illness:    Michael Mccoy is a 57 y.o. male with a hx of hypertension, arthritis, mesenteric artery stenosis, nephrolithiasis, depression, and suicidal behavior, here for follow-up. He was initially seen 09/07/2021 in the [facility]. He was in the ER 04/2021 for a fall. His blood pressure was 182/104. He was on amlodipine and losartan, and he was referred to [facility]. He saw Dr. Modesto Charon on 06/2021 and his BP was 163/92.   At his initial appointment, he complained of non-exertional chest pressure in the setting of stress and anxiety. He reported issues with high blood pressure since 1991. At that time his BP had spiked to the 200s systolic and he passed out, falling to the floor. His home blood pressures were similar to his clinical reading 167/95. He noted that he was prone to dehydration as his work kept him outside in the heat, therefore his HCTZ was previously discontinued. He also had bradycardia while on beta blockers. We switched losartan to valsartan 320 mg daily, and added hydralazine 50 mg TID. Renin and aldosterone were ordered but not completed. He was enrolled in our RPM study. On 10/07/21 he saw our pharmacist and his BP was 153/87. Readings through [facility] were sporadic and ranged from 158/92 to 189/105. He was started on doxazosin 2 mg daily to assist with hypertension and BPH symptoms. He was advised that he could change his amlodipine to nighttime administration since he had been taking all of his medications in the AM. He was seen by Wallis Bamberg, NP 09/2022 and was feeling poorly with sinus pressure and dry cough for 1 month. He thought his symptoms may be related to  starting Cardura. His BP in the office was 174/106. He was not monitoring his BP at home consistently and he frequently missed doses of hydralazine. His hydralazine was increased to 100 mg; he would try to take it twice daily. LDL was 146 as of 09/2022 and he was started on rosuvastatin 10 mg daily. 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 In 11/2022 his blood pressure remained uncontrolled. Sometimes forgot to take his nighttime medications. Doxazosin was increased.    At his visit 01/2023, he complained of myalgias and we discontinued rosuvastatin. He also had excessive fatigue and home blood pressures were averaging 148/76. Hydralazine was increased to 100 mg TID. We also discussed the renal denervation procedure. Renin and aldosterone levels were normal. Normal kidney and liver function. Cholesterol was at goal. He was referred for [place] sleep study, not completed.  At his visit 03/2023 he reported muscle cramps.  His blood pressure was averaging in the 130s to 140s.  He also had some low blood pressures in the 90s to 100s and felt dizzy when this occurred.  At the office visit he had run out of doxazosin in office blood pressure was elevated.  Doxazosin was resumed and plans were made to consider RDN.  He was still working to get insurance to approve a sleep study.  Previous antihypertensives: HCTZ - prone to dehydration working outside [place]  Past Medical History:  Diagnosis Date   Anxiety  Arthritis    Chest pressure 09/07/2021   Depression    History of kidney stones    Hypertension    Pure hypercholesterolemia 09/07/2021   Resistant hypertension 09/07/2021   Suicidal behavior 2015    Past Surgical History:  Procedure Laterality Date   ANKLE SURGERY Left    COLONOSCOPY  08/2018   CYSTOSCOPY WITH RETROGRADE PYELOGRAM, URETEROSCOPY AND STENT PLACEMENT Right 01/30/2019   Procedure: CYSTOSCOPY WITH  RETROGRADE PYELOGRAM, URETEROSCOPY AND STENT PLACEMENT;  Surgeon: Sebastian Ache, MD;  Location: [facility];  Service: Urology;  Laterality: Right;  90 MINS   HOLMIUM LASER APPLICATION Right 01/30/2019   Procedure: HOLMIUM LASER APPLICATION;  Surgeon: Sebastian Ache, MD;  Location: [facility];  Service: Urology;  Laterality: Right;   KIDNEY STONE SURGERY     Several    Current Medications: No outpatient medications have been marked as taking for the 07/19/23 encounter (Appointment) with [name], MD.     Allergies:   Patient has no known allergies.   Social History   Socioeconomic History   Marital status: Divorced    Spouse name: Not on file   Number of children: Not on file   Years of education: Not on file   Highest education level: Not on file  Occupational History   Not on file  Tobacco Use   Smoking status: Never   Smokeless tobacco: Never  Vaping Use   Vaping status: Never Used  Substance and Sexual Activity   Alcohol use: Yes   Drug use: Yes    Types: Marijuana    Comment: occ. last time several weeks ago   Sexual activity: Not on file  Other Topics Concern   Not on file  Social History Narrative   Not on file   Social Determinants of Health   Financial Resource Strain: Low Risk  (09/07/2021)   Overall Financial Resource Strain (CARDIA)    Difficulty of Paying Living Expenses: Not hard at all  Food Insecurity: No Food Insecurity (09/07/2021)   Hunger Vital Sign    Worried About Running Out of Food in the Last Year: Never true    Ran Out of Food in the Last Year: Never true  Transportation Needs: No Transportation Needs (09/07/2021)   PRAPARE - Administrator, Civil Service (Medical): No    Lack of Transportation (Non-Medical): No  Physical Activity: Unknown (09/07/2021)   Exercise Vital Sign    Days of Exercise per Week: Not on file    Minutes of Exercise per Session: 0 min  Stress: Not on file  Social  Connections: Unknown (12/20/2021)   Received from [facility]   Social Network    Social Network: Not on file     Family History: The patient's family history includes Bradycardia in his father and mother; COPD in his father; Heart attack in his father.  ROS:   Please see the history of present illness.    (+) Dizzy spells (+) Generalized weakness (+) Left sided muscle cramps of back and thigh (+) Excessive fatigue (+) Daytime somnolence All other systems reviewed and are negative.  EKGs/Labs/Other Studies Reviewed:    Bilateral Renal Artery Doppler  10/28/2022: Summary:  Renal:    Right: No evidence of right renal artery stenosis. RRV flow present.         Normal size right kidney. Normal right Resisitive Index.         Normal cortical thickness of right kidney.  Left:  No evidence of left renal artery stenosis. LRV flow present.         Normal size of left kidney. Normal left Resistive Index.         Normal cortical thickness of the left kidney.  Mesenteric:  Normal Celiac artery findings. 70 to 99% stenosis in the superior  Mesenteric artery.   EKG:  EKG is personally reviewed. 04/05/2023:  Not ordered. 11/16/2022:  Not ordered. 09/07/2021:  Not ordered.   Recent Labs: 09/29/2022: TSH 0.723 01/12/2023: ALT 26; BUN 19; Creatinine, Ser 1.07; Potassium 4.6; Sodium 141   Recent Lipid Panel    Component Value Date/Time   CHOL 135 01/12/2023 1037   TRIG 59 01/12/2023 1037   HDL 64 01/12/2023 1037   CHOLHDL 2.1 01/12/2023 1037   LDLCALC 58 01/12/2023 1037    Physical Exam:    VS:  There were no vitals taken for this visit. , BMI There is no height or weight on file to calculate BMI. GENERAL:  Well appearing HEENT: Pupils equal round and reactive, fundi not visualized, oral mucosa unremarkable NECK:  No jugular venous distention, waveform within normal limits, carotid upstroke brisk and symmetric, no bruits, no thyromegaly LUNGS:  Clear to auscultation  bilaterally HEART:  RRR.  PMI not displaced or sustained,S1 and S2 within normal limits, no S3, no S4, no clicks, no rubs, no murmurs ABD:  Flat, positive bowel sounds normal in frequency in pitch, no bruits, no rebound, no guarding, no midline pulsatile mass, no hepatomegaly, no splenomegaly EXT:  2 plus pulses throughout, no edema, no cyanosis no clubbing SKIN:  No rashes no nodules NEURO:  Cranial nerves II through XII grossly intact, motor grossly intact throughout PSYCH:  Cognitively intact, oriented to person place and time   ASSESSMENT/PLAN:           Screening for Secondary Hypertension:     09/07/2021    8:26 AM  Causes  Drugs/Herbals Screened     - Comments limiting caffeine, no tobacco.  struggles with diet.  Renovascular HTN Not Screened     - Comments Renal artery Dopplers were appropriately ordered but he is unable to afford them at this time.  Sleep Apnea Screened     - Comments no sympotms  Thyroid Disease Screened     - Comments 1.1 on 06/2021  Hyperaldosteronism Screened     - Comments Check renal and and aldosterone  Pheochromocytoma Screened     - Comments No symptoms  Cushing's Syndrome Screened     - Comments No symptoms  Hyperparathyroidism Screened  Coarctation of the Aorta Screened     - Comments BP symmetric  Compliance Screened    Relevant Labs/Studies:    Latest Ref Rng & Units 01/12/2023   10:37 AM 09/05/2022    8:58 AM 01/30/2019   11:16 AM  Basic Labs  Sodium 134 - 144 mmol/L 141  140  138   Potassium 3.5 - 5.2 mmol/L 4.6  3.8  3.7   Creatinine 0.76 - 1.27 mg/dL 5.28  4.13  2.44      Disposition:    FU with Miguel Medal C. Duke Salvia, MD, [facility] in 3-4 months.  Medication Adjustments/Labs and Tests Ordered: Current medicines are reviewed at length with the patient today.  Concerns regarding medicines are outlined above.   No orders of the defined types were placed in this encounter.  No orders of the defined types were placed in this  encounter.    Signed, [name], MD  07/19/2023 7:49 AM    [facility]

## 2023-11-12 ENCOUNTER — Other Ambulatory Visit (HOSPITAL_BASED_OUTPATIENT_CLINIC_OR_DEPARTMENT_OTHER): Payer: Self-pay | Admitting: Family Medicine

## 2024-02-06 ENCOUNTER — Other Ambulatory Visit (HOSPITAL_BASED_OUTPATIENT_CLINIC_OR_DEPARTMENT_OTHER): Payer: Self-pay | Admitting: Family

## 2024-02-29 ENCOUNTER — Other Ambulatory Visit (HOSPITAL_BASED_OUTPATIENT_CLINIC_OR_DEPARTMENT_OTHER): Payer: Self-pay | Admitting: Family

## 2024-03-04 ENCOUNTER — Other Ambulatory Visit (HOSPITAL_BASED_OUTPATIENT_CLINIC_OR_DEPARTMENT_OTHER): Payer: Self-pay | Admitting: Cardiovascular Disease

## 2024-05-06 ENCOUNTER — Ambulatory Visit: Admitting: Family

## 2024-05-06 ENCOUNTER — Encounter: Payer: Self-pay | Admitting: Family

## 2024-05-06 ENCOUNTER — Ambulatory Visit: Payer: Self-pay

## 2024-05-06 VITALS — BP 171/103 | HR 56 | Temp 97.2°F | Ht 73.0 in | Wt 202.8 lb

## 2024-05-06 DIAGNOSIS — N401 Enlarged prostate with lower urinary tract symptoms: Secondary | ICD-10-CM | POA: Diagnosis not present

## 2024-05-06 DIAGNOSIS — R3912 Poor urinary stream: Secondary | ICD-10-CM

## 2024-05-06 DIAGNOSIS — I1A0 Resistant hypertension: Secondary | ICD-10-CM

## 2024-05-06 DIAGNOSIS — R109 Unspecified abdominal pain: Secondary | ICD-10-CM | POA: Diagnosis not present

## 2024-05-06 LAB — POCT URINALYSIS DIPSTICK
Bilirubin, UA: NEGATIVE
Blood, UA: NEGATIVE
Glucose, UA: NEGATIVE
Ketones, UA: NEGATIVE
Leukocytes, UA: NEGATIVE
Nitrite, UA: NEGATIVE
Protein, UA: POSITIVE — AB
Spec Grav, UA: 1.015 (ref 1.010–1.025)
Urobilinogen, UA: 0.2 U/dL
pH, UA: 6 (ref 5.0–8.0)

## 2024-05-06 MED ORDER — VALSARTAN 320 MG PO TABS: 320.0000 mg | ORAL_TABLET | Freq: Every day | ORAL | 0 refills | Status: DC

## 2024-05-06 MED ORDER — CLONIDINE HCL 0.1 MG PO TABS
0.1000 mg | ORAL_TABLET | Freq: Once | ORAL | Status: AC
  Administered 2024-05-06: 0.1 mg via ORAL

## 2024-05-06 NOTE — Telephone Encounter (Signed)
 FYI Only or Action Required?: FYI only for provider.  Patient was last seen in primary care on 09/05/2022 by Booker Darice SAUNDERS, FNP.  Called Nurse Triage reporting Flank Pain.  Symptoms began several weeks ago.  Interventions attempted: Nothing.  Symptoms are: gradually worsening.  Triage Disposition: See Physician Within 24 Hours  Patient/caregiver understands and will follow disposition?: Yes, will follow disposition  Copied from CRM #[id number]. Topic: Clinical - Red Word Triage >> May 06, 2024  9:02 AM Wess RAMAN wrote: Red Word that prompted transfer to Nurse Triage:  Kidney stone or a really bad kidney infection.  Pain level as high as 8. Currently 4-5 Reason for Disposition  MODERATE pain (e.g., interferes with normal activities or awakens from sleep)  Answer Assessment - Initial Assessment Questions 1. LOCATION: Where does it hurt? (e.g., left, right)     R side 2. ONSET: When did the pain start?     2 weeks, states has not gotten worse nor better 3. SEVERITY: How bad is the pain? (e.g., Scale 1-10; mild, moderate, or severe)     4-5 4. PATTERN: Does the pain come and go, or is it constant?      Severity fluctuates up to 8 at times 5. CAUSE: What do you think is causing the pain?     Kidney stone 6. OTHER SYMPTOMS:  Do you have any other symptoms? (e.g., fever, abdomen pain, vomiting, leg weakness, burning with urination, blood in urine)     Denies blood. + constipation  Protocols used: Flank Pain-A-AH

## 2024-05-06 NOTE — Progress Notes (Signed)
 Patient ID: Michael Mccoy, male    DOB: March 20, 1966, 58 y.o.   MRN: [id number]  Chief Complaint  Patient presents with   Flank Pain    Pt c/o flank pain, present for a couple of weeks.    Hypertension    Pt did not take Blood pressure medication this morning.   Discussed the use of AI scribe software for clinical note transcription with the patient, who gave verbal consent to proceed.  History of Present Illness   Michael Mccoy is a 58 year old male with hypertension and a history of kidney stones who presents with right-sided back pain.  He experiences constant right-sided back pain for two weeks, initiated by a 'bad pull' while exiting a car. The pain is localized near the kidney, rated 6 to 7 out of 10, occasionally reaching 10. It has slightly eased but remains constant.  He has a history of kidney stones, with the last episode two years ago requiring laser surgery. He has undergone lithotripsy multiple times. There is no burning, difficulty with urination, or visible hematuria. He tries to hydrate with two liters of water daily and took tamsulosin last week without significant symptom relief. He also is taking Cardura  daily.  He has hypertension, managed with multiple medications, but missed his morning doses today. He usually takes it twice daily, though prescribed three times daily. He experiences dizziness and nausea from these medications.     Assessment and Plan    Right-sided abdominal and flank pain, possible nephrolithiasis Persistent pain possibly due to nephrolithiasis. Taking Cardura  daily. No hematuria or infection. History of kidney stones. UA in office negative. - Advised to increase Cardura  to twice daily for 3 days, then back to qd dosing. - Encouraged increased fluid intake, at least 2L daily. - Advised monitoring pain and seeking ED if unbearable. - Consider urgent imaging if no improvement by week's end. - Discussed potential urology referral if symptoms  persist.  Resistant hypertension Severely elevated blood pressure likely due to missed doses. Resistant hypertension on multiple medications. Side effects present. Discussed renal denervation as future option. Emphasized medication adherence to prevent complications. Taking Amlodipine  10mg , Hydralazine  100mg  bid, but ran out of Valsartan . - Administered clonidine 0.1mg   in-office to acutely lower blood pressure. - Sending refill of Valsartan  320mg  qam, take Amlodipine  qpm or qhs and Hydralazine  in between Valsartan  and Amlodipine  to reduce dizziness. - Reinforced medication adherence, suggested keeping extra doses accessible in either car or at work. - Encouraged lifestyle modifications: low sodium diet, increased hydration, regular exercise. - Call Cardiology office to schedule follow up appointment.      Subjective:    Outpatient Medications Prior to Visit  Medication Sig Dispense Refill   amLODipine  (NORVASC ) 10 MG tablet TAKE 1 TABLET BY MOUTH DAILY 90 tablet 3   doxazosin  (CARDURA ) 4 MG tablet Take 1 tablet (4 mg total) by mouth daily. 90 tablet 3   hydrALAZINE  (APRESOLINE ) 100 MG tablet TAKE 1 TABLET BY MOUTH 3 TIMES A DAY 270 tablet 3   tadalafil  (CIALIS ) 10 MG tablet Take 1 tablet (10 mg total) by mouth daily as needed. 20 tablet 3   valsartan  (DIOVAN ) 320 MG tablet Take 1 tablet (320 mg total) by mouth daily. Please call [phone] to schedule an appointment for future refills. Thank you. 30 tablet 0   No facility-administered medications prior to visit.   Past Medical History:  Diagnosis Date   Anxiety    Arthritis    Chest pressure  09/07/2021   Depression    History of kidney stones    Hypertension    Pure hypercholesterolemia 09/07/2021   Resistant hypertension 09/07/2021   Suicidal behavior 2015   Past Surgical History:  Procedure Laterality Date   ANKLE SURGERY Left    COLONOSCOPY  08/2018   CYSTOSCOPY WITH RETROGRADE PYELOGRAM, URETEROSCOPY AND STENT PLACEMENT  Right 01/30/2019   Procedure: CYSTOSCOPY WITH RETROGRADE PYELOGRAM, URETEROSCOPY AND STENT PLACEMENT;  Surgeon: Alvaro Hummer, MD;  Location: [facility];  Service: Urology;  Laterality: Right;  90 MINS   HOLMIUM LASER APPLICATION Right 01/30/2019   Procedure: HOLMIUM LASER APPLICATION;  Surgeon: Alvaro Hummer, MD;  Location: [facility];  Service: Urology;  Laterality: Right;   KIDNEY STONE SURGERY     Several   No Known Allergies    Objective:    Physical Exam Vitals and nursing note reviewed.  Constitutional:      General: He is not in acute distress.    Appearance: Normal appearance.  HENT:     Head: Normocephalic.  Cardiovascular:     Rate and Rhythm: Normal rate and regular rhythm.  Pulmonary:     Effort: Pulmonary effort is normal.     Breath sounds: Normal breath sounds.  Musculoskeletal:     Cervical back: Normal range of motion.     Thoracic back: Spasms and tenderness (just beneath right posterior rib cage) present. No swelling, signs of trauma or bony tenderness. Decreased range of motion.       Back:  Skin:    General: Skin is warm and dry.  Neurological:     Mental Status: He is alert and oriented to person, place, and time.  Psychiatric:        Mood and Affect: Mood normal.    BP (!) 171/103 (BP Location: Left Arm, Patient Position: Sitting, Cuff Size: Large)   Pulse (!) 56   Temp (!) 97.2 F (36.2 C) (Temporal)   Ht 6' 1 (1.854 m)   Wt 202 lb 12.8 oz (92 kg)   SpO2 99%   BMI 26.76 kg/m  Wt Readings from Last 3 Encounters:  05/06/24 202 lb 12.8 oz (92 kg)  04/05/23 197 lb 3.4 oz (89.5 kg)  01/12/23 198 lb 4.8 oz (89.9 kg)      Lucius Krabbe, NP

## 2024-05-06 NOTE — Patient Instructions (Signed)
 It was very nice to see you today!   Increase your Doxazosin  to twice a day for 3 days to help with your flank pain if related to possible kidney stone, then resume 1 pill daily. Take extra strength Tylenol  or arthritis strength tylenol  3 times per day for pain. Call at end of week if pain is not better.  I have also sent in a refill for your Valsartan .  I recommend taking this in the morning and the Amlodipine  at supper or bedtime.  Continue the Hydralazine  twice a day. Can take in between the Valsartan  and Amlodipine . Spreading the doses out may help with your dizziness. Call Cardiology to schedule follow up appointment asap!     PLEASE NOTE:  If you had any lab tests please let us  know if you have not heard back within a few days. You may see your results on MyChart before we have a chance to review them but we will give you a call once they are reviewed by us . If we ordered any referrals today, please let us  know if you have not heard from their office within the next week.

## 2024-05-06 NOTE — Telephone Encounter (Signed)
 Please let patient know regardless what is said at appt today will still need to follow up witih Dr everitt Peru. He has not been seen in over a year

## 2024-05-29 ENCOUNTER — Other Ambulatory Visit (HOSPITAL_BASED_OUTPATIENT_CLINIC_OR_DEPARTMENT_OTHER): Payer: Self-pay | Admitting: Cardiovascular Disease

## 2024-05-31 MED ORDER — AMLODIPINE BESYLATE 10 MG PO TABS: 10.0000 mg | ORAL_TABLET | Freq: Every day | ORAL | 0 refills | Status: DC

## 2024-05-31 MED ORDER — HYDRALAZINE HCL 100 MG PO TABS: 100.0000 mg | ORAL_TABLET | Freq: Three times a day (TID) | ORAL | 0 refills | Status: DC

## 2024-06-18 ENCOUNTER — Ambulatory Visit (HOSPITAL_BASED_OUTPATIENT_CLINIC_OR_DEPARTMENT_OTHER): Admitting: Family Medicine

## 2024-06-18 ENCOUNTER — Ambulatory Visit (INDEPENDENT_AMBULATORY_CARE_PROVIDER_SITE_OTHER)

## 2024-06-18 ENCOUNTER — Ambulatory Visit (HOSPITAL_BASED_OUTPATIENT_CLINIC_OR_DEPARTMENT_OTHER): Payer: Self-pay | Admitting: Family Medicine

## 2024-06-18 ENCOUNTER — Encounter (HOSPITAL_BASED_OUTPATIENT_CLINIC_OR_DEPARTMENT_OTHER): Payer: Self-pay | Admitting: Family Medicine

## 2024-06-18 VITALS — BP 161/71 | HR 62 | Temp 98.1°F | Ht 73.0 in | Wt 210.0 lb

## 2024-06-18 DIAGNOSIS — M5126 Other intervertebral disc displacement, lumbar region: Secondary | ICD-10-CM | POA: Diagnosis not present

## 2024-06-18 DIAGNOSIS — I1A0 Resistant hypertension: Secondary | ICD-10-CM | POA: Diagnosis not present

## 2024-06-18 DIAGNOSIS — M545 Low back pain, unspecified: Secondary | ICD-10-CM | POA: Diagnosis not present

## 2024-06-18 DIAGNOSIS — M47816 Spondylosis without myelopathy or radiculopathy, lumbar region: Secondary | ICD-10-CM | POA: Diagnosis not present

## 2024-06-18 LAB — POCT URINALYSIS DIP (CLINITEK)
Bilirubin, UA: NEGATIVE
Blood, UA: NEGATIVE
Glucose, UA: NEGATIVE mg/dL
Ketones, POC UA: NEGATIVE mg/dL
Leukocytes, UA: NEGATIVE
Nitrite, UA: NEGATIVE
POC PROTEIN,UA: NEGATIVE
Spec Grav, UA: 1.01 (ref 1.010–1.025)
Urobilinogen, UA: 0.2 U/dL
pH, UA: 5.5 (ref 5.0–8.0)

## 2024-06-18 MED ORDER — VALSARTAN 320 MG PO TABS: 320.0000 mg | ORAL_TABLET | Freq: Every day | ORAL | 1 refills | Status: DC

## 2024-06-18 MED ORDER — CYCLOBENZAPRINE HCL 5 MG PO TABS: 5.0000 mg | ORAL_TABLET | Freq: Three times a day (TID) | ORAL | 1 refills | Status: DC | PRN

## 2024-06-18 MED ORDER — TADALAFIL 10 MG PO TABS
10.0000 mg | ORAL_TABLET | Freq: Every day | ORAL | 3 refills | Status: AC | PRN
Start: 2024-06-18 — End: ?

## 2024-06-18 NOTE — Assessment & Plan Note (Signed)
 Patient is overdue for follow-up with cardiology.  Blood pressure is elevated in office today.  Patient does have acute pain which could be affecting her blood pressure today as well. He was advised on getting sleep study for further evaluation of sleep apnea, however did not ultimately have this completed with cardiology previously. He indicates that he does have a phone number to cardiology office, recommend contacting them to schedule follow-up appointment as he is overdue currently Can continue with current medication regimen Recommend intermittent monitoring of blood pressure at home, DASH diet

## 2024-06-18 NOTE — Progress Notes (Signed)
 Procedures performed today:    None.  Independent interpretation of notes and tests performed by another provider:   None.  Brief History, Exam, Impression, and Recommendations:    BP (!) 161/71 (BP Location: Left Arm, Patient Position: Sitting, Cuff Size: Normal)   Pulse 62   Temp 98.1 F (36.7 C) (Oral)   Ht 6' 1 (1.854 m)   Wt 210 lb (95.3 kg)   SpO2 96%   BMI 27.71 kg/m   Discussed the use of AI scribe software for clinical note transcription with the patient, who gave verbal consent to proceed.  History of Present Illness Michael Mccoy is a 58 year old male with a history of kidney stones who presents with persistent lower back pain.  He has persistent lower back pain localized just above the belt line on the right side. The pain has not subsided despite taking leftover oxycodone  5/325 mg, which he uses sparingly due to discomfort with frequent use. He describes the pain as similar to past kidney stone pain but notes it is in a slightly different location. The pain is exacerbated by sitting or resting and is present throughout the night, affecting his sleep. He uses a heating pad for relief, which provides some benefit. No radiation of pain down the leg, although he experiences discomfort when lifting his leg or putting on pants. No blood in urine, and previous urine tests did not show any abnormalities. He has a history of four lithotripsies and laser removal for kidney stones approximately two years ago.  He also mentions shoulder pain, for which he has previously received cortisone injections due to arthritis. He has a history of arthritis in the shoulder and has previously received cortisone injections for pain in this area.  He is currently on blood pressure medication and wants to reduce the number of medications he is taking. He also has ADHD and previously took Adderall, which he found beneficial but had to discontinue due to its impact on his blood pressure. He  is interested in exploring non-stimulant options for managing ADHD symptoms.  On exam, patient is in mild discomfort, will have increased pain while sitting in the room.  Vital signs stable, blood pressure elevated. Normal gait in office.  He does have some tenderness to palpation through right paraspinal muscles in lumbar region, no tenderness to palpation along left side.  Straight leg raise reproduces pain in low back, however does not have any radiation of pain into lower extremities.  No CVA tenderness.  Right-sided low back pain without sciatica, unspecified chronicity Assessment & Plan: Chronic right-sided low back painr, likely musculoskeletal. Less likely kidney stones. - Ordered updated lumbar spine x-rays. - Repeated urine testing - normal, no blood observed - consider referral to physical therapy. - Referred to spine specialist. - Prescribed Flexeril .  Orders: -     DG Lumbar Spine Complete; Future -     Ambulatory referral to Orthopedic Surgery  Resistant hypertension Assessment & Plan: Patient is overdue for follow-up with cardiology.  Blood pressure is elevated in office today.  Patient does have acute pain which could be affecting her blood pressure today as well. He was advised on getting sleep study for further evaluation of sleep apnea, however did not ultimately have this completed with cardiology previously. He indicates that he does have a phone number to cardiology office, recommend contacting them to schedule follow-up appointment as he is overdue currently Can continue with current medication regimen Recommend intermittent monitoring of blood  pressure at home, DASH diet  Orders: -     Valsartan ; Take 1 tablet (320 mg total) by mouth daily. Call Cardiology for appointment.  Dispense: 90 tablet; Refill: 1  Other orders -     Tadalafil ; Take 1 tablet (10 mg total) by mouth daily as needed.  Dispense: 20 tablet; Refill: 3 -     Cyclobenzaprine  HCl; Take 1 tablet (5  mg total) by mouth 3 (three) times daily as needed for muscle spasms.  Dispense: 30 tablet; Refill: 1  Patient also with questions about ADHD management.  Reports that he has been unable to take any stimulant medication due to elevated blood pressure.  We did review precautions related to this.  We also discussed nonstimulant medications.  There would still be some concern with Strattera given his blood pressure issues.  We discussed Wellbutrin, however patient notes that he thinks he tried this in the past and has some sexual side effects as a result.  For now, we will continue with focusing on acute issue of low back pain as well as focusing on better managing blood pressure and can revisit ADHD in the future  Return in about 6 weeks (around 07/30/2024).  Spent 32 minutes on this patient encounter, including preparation, chart review, face-to-face counseling with patient and coordination of care, and documentation of encounter   ___________________________________________ Michael [place]cap[place], MD, ABFM, [facility] Sports Medicine [facility]

## 2024-06-18 NOTE — Addendum Note (Signed)
 Addended by: OLIVA-AVELLANEDA, Namiyah Grantham L on: 06/18/2024 04:41 PM   Modules accepted: Orders

## 2024-06-18 NOTE — Assessment & Plan Note (Signed)
 Chronic right-sided low back painr, likely musculoskeletal. Less likely kidney stones. - Ordered updated lumbar spine x-rays. - Repeated urine testing - normal, no blood observed - consider referral to physical therapy. - Referred to spine specialist. - Prescribed Flexeril .

## 2024-06-19 ENCOUNTER — Other Ambulatory Visit (HOSPITAL_BASED_OUTPATIENT_CLINIC_OR_DEPARTMENT_OTHER): Payer: Self-pay | Admitting: Cardiovascular Disease

## 2024-06-21 ENCOUNTER — Ambulatory Visit (HOSPITAL_BASED_OUTPATIENT_CLINIC_OR_DEPARTMENT_OTHER): Admitting: Student

## 2024-07-03 ENCOUNTER — Ambulatory Visit: Admitting: Physical Medicine and Rehabilitation

## 2024-07-03 ENCOUNTER — Encounter: Payer: Self-pay | Admitting: Physical Medicine and Rehabilitation

## 2024-07-03 DIAGNOSIS — M47816 Spondylosis without myelopathy or radiculopathy, lumbar region: Secondary | ICD-10-CM

## 2024-07-03 DIAGNOSIS — G8929 Other chronic pain: Secondary | ICD-10-CM

## 2024-07-03 DIAGNOSIS — M5441 Lumbago with sciatica, right side: Secondary | ICD-10-CM

## 2024-07-03 DIAGNOSIS — M5416 Radiculopathy, lumbar region: Secondary | ICD-10-CM | POA: Diagnosis not present

## 2024-07-03 MED ORDER — OXYCODONE-ACETAMINOPHEN 5-325 MG PO TABS: 1.0000 | ORAL_TABLET | Freq: Three times a day (TID) | ORAL | 0 refills | Status: DC | PRN

## 2024-07-03 NOTE — Progress Notes (Signed)
 Core Outcome Measures Index (COMI) Back Score  Average Pain 6  COMI Score 60 %

## 2024-07-03 NOTE — Progress Notes (Signed)
 Pain Scale   Average Pain 6 Patient advising he has lower back pain radiating to right leg.        +Driver, -BT, -Dye Allergies.

## 2024-07-03 NOTE — Progress Notes (Signed)
 Michael Mccoy - 58 y.o. male MRN [id number]  Date of birth: 1966-07-12  Office Visit Note: Visit Date: 07/03/2024 PCP: everitt Cuba, Quintin PARAS, MD Referred by: de Cuba, [name], MD  Subjective: Chief Complaint  Patient presents with   Lower Back - Pain   HPI: [name] is a 58 y.o. male who comes in today per the request of Dr. Quintin de Cuba for evaluation of chronic, worsening and severe right sided lower back pain radiating to anterolateral thigh down to knee. Pain started several months ago. He was initially concerned this pain was kidney stone, however this was ruled out by PCP in September. His pain worsens with prolonged sitting and outdoor activities. He describes pain as sharp and stabbing sensation, currently rates as 6 out of 10. Some relief of pain with home exercise regimen, heating pad, rest and use of medications. Some relief of pain with soaks in hot tub. He also tried left over oxycodone  and Flexeril  with minimal relief of pain. No history of formal physical therapy. Recent lumbar radiographs shows multilevel listhesis and degenerative changes. Moderate lower lumbar facet arthropathy, greatest at L5-S1. Patient denies focal weakness, numbness and tingling. No recent trauma or falls.       Review of Systems  Musculoskeletal:  Positive for back pain.  Neurological:  Negative for tingling, sensory change, focal weakness and weakness.  All other systems reviewed and are negative.  Otherwise per HPI.  Assessment & Plan: Visit Diagnoses:    ICD-10-CM   1. Chronic right-sided low back pain with right-sided sciatica  M54.41    G89.29     2. Lumbar radiculopathy  M54.16     3. Facet arthropathy, lumbar  M47.816        Plan: Findings:  Chronic, worsening and severe right sided lower back pain radiating to anterolateral thigh down to knee. Patient continues to have severe pain despite good conservative therapies such as home exercise regimen, rest and use of  medications. Patients clinical presentation and exam are consistent with lumbar radiculopathy, more of L3 nerve pattern. His pain does fit with possible disc herniation. No right sided groin pain, no pain with rotation of right hip. We discussed treatment plan in detail today. Next step is to place order for lumbar MRI imaging. I also discussed medication management and prescribed short course of Percocet while we are waiting on MRI. Depending on results of MRI imaging we discussed possibility of performing lumbar epidural steroid injection. He has no questions at this time. His exam today is non focal, good strength noted to bilateral lower extremities. We will see him back for lumbar MRI review.     Meds & Orders: No orders of the defined types were placed in this encounter.  No orders of the defined types were placed in this encounter.   Follow-up: Return for Lumbar MRI review.   Procedures: No procedures performed      Clinical History: CLINICAL DATA:  Low back pain, primarily right side   EXAM: LUMBAR SPINE - COMPLETE 4+ VIEW   COMPARISON:  April 22, 2021   FINDINGS: Five non-rib-bearing lumbar-type vertebral bodies are identified on the frontal radiograph. No evidence of pars defects on the oblique images. Trace retrolisthesis of L2 on L3, L3 on L4 and L5 on S1. This is not significantly changed from the prior exam. Age-indeterminate height loss of the L1 vertebral body, new since 2022. Vertebral body heights otherwise preserved. Mild-to-moderate multilevel degenerative disc disease throughout the  lumbar spine, greatest at L1-L2, L4-L5 and L5-S1. Moderate lower lumbar facet arthropathy, greatest at L5-S1. Vascular calcifications are present. SI joints are symmetric. The sacrum is poorly assessed but grossly unremarkable. Bowel-gas pattern is nonobstructive.   IMPRESSION: Multilevel listhesis and degenerative changes as above.     Electronically Signed   By: Norleen Croak M.D.   On: 06/19/2024 10:27   He reports that he has never smoked. He has never used smokeless tobacco. No results for input(s): HGBA1C, LABURIC in the last 8760 hours.  Objective:  VS:  HT:    WT:   BMI:     BP:   HR: bpm  TEMP: ( )  RESP:  Physical Exam Vitals and nursing note reviewed.  HENT:     Head: Normocephalic and atraumatic.     Right Ear: External ear normal.     Left Ear: External ear normal.     Nose: Nose normal.     Mouth/Throat:     Mouth: Mucous membranes are moist.  Eyes:     Extraocular Movements: Extraocular movements intact.  Cardiovascular:     Rate and Rhythm: Normal rate.     Pulses: Normal pulses.  Pulmonary:     Effort: Pulmonary effort is normal.  Abdominal:     General: Abdomen is flat. There is no distension.  Musculoskeletal:        General: Tenderness present.     Cervical back: Normal range of motion.     Comments: Patient rises from seated position to standing without difficulty. Good lumbar range of motion. No pain noted with facet loading. 5/5 strength noted with bilateral hip flexion, knee flexion/extension, ankle dorsiflexion/plantarflexion and EHL. No clonus noted bilaterally. No pain upon palpation of greater trochanters. No pain with internal/external rotation of bilateral hips. Sensation intact bilaterally. Dysesthesias noted to right L3 dermatome. Equivocally positive slump test on the right. Ambulates without aid, gait steady.     Skin:    General: Skin is warm and dry.     Capillary Refill: Capillary refill takes less than 2 seconds.  Neurological:     General: No focal deficit present.     Mental Status: He is alert and oriented to person, place, and time.  Psychiatric:        Mood and Affect: Mood normal.        Behavior: Behavior normal.     Ortho Exam  Imaging: No results found.  Past Medical/Family/Surgical/Social History: Medications & Allergies reviewed per EMR, new medications updated. Patient Active  Problem List   Diagnosis Date Noted   Right-sided low back pain without sciatica 06/18/2024   Chronic pain of left ankle 09/05/2022   URI (upper respiratory infection) 08/04/2022   Hip pain 07/12/2022   Erectile dysfunction 06/14/2022   Sleep apnea 06/08/2022   Resistant hypertension 09/07/2021   Pure hypercholesterolemia 09/07/2021   Chest pressure 09/07/2021   Past Medical History:  Diagnosis Date   Anxiety    Arthritis    Chest pressure 09/07/2021   Depression    History of kidney stones    Hypertension    Pure hypercholesterolemia 09/07/2021   Resistant hypertension 09/07/2021   Suicidal behavior 2015   Family History  Problem Relation Age of Onset   Bradycardia Mother    Heart attack Father    COPD Father    Bradycardia Father    Past Surgical History:  Procedure Laterality Date   ANKLE SURGERY Left    COLONOSCOPY  08/2018  CYSTOSCOPY WITH RETROGRADE PYELOGRAM, URETEROSCOPY AND STENT PLACEMENT Right 01/30/2019   Procedure: CYSTOSCOPY WITH RETROGRADE PYELOGRAM, URETEROSCOPY AND STENT PLACEMENT;  Surgeon: Alvaro Hummer, MD;  Location: [facility];  Service: Urology;  Laterality: Right;  90 MINS   HOLMIUM LASER APPLICATION Right 01/30/2019   Procedure: HOLMIUM LASER APPLICATION;  Surgeon: Alvaro Hummer, MD;  Location: [facility];  Service: Urology;  Laterality: Right;   KIDNEY STONE SURGERY     Several   Social History   Occupational History   Not on file  Tobacco Use   Smoking status: Never   Smokeless tobacco: Never  Vaping Use   Vaping status: Never Used  Substance and Sexual Activity   Alcohol use: Yes   Drug use: Yes    Types: Marijuana    Comment: occ. last time several weeks ago   Sexual activity: Not on file

## 2024-07-09 ENCOUNTER — Encounter: Payer: Self-pay | Admitting: Physical Medicine and Rehabilitation

## 2024-07-23 ENCOUNTER — Inpatient Hospital Stay
Admission: RE | Admit: 2024-07-23 | Discharge: 2024-07-23 | Attending: Physical Medicine and Rehabilitation | Admitting: Physical Medicine and Rehabilitation

## 2024-07-23 DIAGNOSIS — M47816 Spondylosis without myelopathy or radiculopathy, lumbar region: Secondary | ICD-10-CM

## 2024-07-23 DIAGNOSIS — M5416 Radiculopathy, lumbar region: Secondary | ICD-10-CM

## 2024-07-23 DIAGNOSIS — G8929 Other chronic pain: Secondary | ICD-10-CM

## 2024-07-29 ENCOUNTER — Ambulatory Visit: Payer: Self-pay

## 2024-07-30 ENCOUNTER — Ambulatory Visit (HOSPITAL_BASED_OUTPATIENT_CLINIC_OR_DEPARTMENT_OTHER): Admitting: Family Medicine

## 2024-08-07 ENCOUNTER — Other Ambulatory Visit: Payer: Self-pay

## 2024-08-07 ENCOUNTER — Encounter (HOSPITAL_BASED_OUTPATIENT_CLINIC_OR_DEPARTMENT_OTHER): Payer: Self-pay | Admitting: Family Medicine

## 2024-08-07 ENCOUNTER — Other Ambulatory Visit (HOSPITAL_BASED_OUTPATIENT_CLINIC_OR_DEPARTMENT_OTHER): Payer: Self-pay

## 2024-08-07 ENCOUNTER — Ambulatory Visit (HOSPITAL_BASED_OUTPATIENT_CLINIC_OR_DEPARTMENT_OTHER): Admitting: Family Medicine

## 2024-08-07 VITALS — BP 159/99 | HR 70 | Temp 98.1°F | Resp 18 | Ht 73.0 in | Wt 199.0 lb

## 2024-08-07 DIAGNOSIS — F909 Attention-deficit hyperactivity disorder, unspecified type: Secondary | ICD-10-CM | POA: Insufficient documentation

## 2024-08-07 DIAGNOSIS — I1A0 Resistant hypertension: Secondary | ICD-10-CM | POA: Diagnosis not present

## 2024-08-07 MED ORDER — AMLODIPINE BESYLATE 10 MG PO TABS
10.0000 mg | ORAL_TABLET | Freq: Every day | ORAL | 0 refills | Status: AC
  Filled 2024-08-07: qty 15, 15d supply, fill #0

## 2024-08-07 MED ORDER — ATOMOXETINE HCL 40 MG PO CAPS: 40.0000 mg | ORAL_CAPSULE | Freq: Every day | ORAL | 0 refills | Status: DC

## 2024-08-07 MED ORDER — VALSARTAN 320 MG PO TABS
320.0000 mg | ORAL_TABLET | Freq: Every day | ORAL | 1 refills | Status: AC
  Filled 2024-08-07: qty 90, 90d supply, fill #0

## 2024-08-07 MED ORDER — HYDRALAZINE HCL 100 MG PO TABS
100.0000 mg | ORAL_TABLET | Freq: Three times a day (TID) | ORAL | 0 refills | Status: AC
  Filled 2024-08-07: qty 45, 15d supply, fill #0

## 2024-08-07 MED ORDER — DOXAZOSIN MESYLATE 4 MG PO TABS
4.0000 mg | ORAL_TABLET | Freq: Every day | ORAL | 0 refills | Status: AC
  Filled 2024-08-07: qty 90, 90d supply, fill #0

## 2024-08-07 MED ORDER — ATOMOXETINE HCL 40 MG PO CAPS
40.0000 mg | ORAL_CAPSULE | Freq: Every day | ORAL | 0 refills | Status: DC
  Filled 2024-08-07: qty 30, 30d supply, fill #0

## 2024-08-07 MED ORDER — HYDROCHLOROTHIAZIDE 12.5 MG PO TABS
12.5000 mg | ORAL_TABLET | Freq: Every day | ORAL | 0 refills | Status: AC
  Filled 2024-08-07: qty 90, 90d supply, fill #0

## 2024-08-07 NOTE — Assessment & Plan Note (Signed)
 Patient continues with valsartan , amlodipine  and hydralazine .  He notes that he is out of doxazosin  currently.  He still has not arranged for follow-up appointment with cardiology despite extensive discussion regarding this during last appointment.  He primarily cites cost of this reason, indicating that co-pay to meet with cardiologist is a lot more than co-pay for our appointments. Blood pressure continues to be above goal.  Medications refilled today.  Again discussed importance of scheduling follow-up appointment with cardiologist.  I can appreciate reluctance to have to spend the money to meet with specialist, but blood pressure continues to be suboptimally controlled and at prior visit with cardiologist, there was discussion for exploring other interventions such as renal denervation.  This would ultimately be something that would need to be discussed further with cardiologist. He previously was reluctant to utilize hydrochlorothiazide due to working outside and having to use restroom more often.  He is now working inside and wonders about starting this medication.  Given elevated blood pressure, would be reasonable to add additional agent.  We can proceed with starting low-dose of hydrochlorothiazide.  He will return for follow-up BMP in about 2 weeks. Recommend intermittent monitoring of blood pressure at home, DASH diet. Will plan to follow-up in 1 month to assess progress with medication.

## 2024-08-07 NOTE — Addendum Note (Signed)
 Addended by: DE CUBA, QUINTIN J on: 08/07/2024 12:14 PM   Modules accepted: Orders

## 2024-08-07 NOTE — Progress Notes (Signed)
 "   Procedures performed today:    None.  Independent interpretation of notes and tests performed by another provider:   None.  Brief History, Exam, Impression, and Recommendations:    BP (!) 159/99 (BP Location: Left Arm, Patient Position: Sitting, Cuff Size: Normal)   Pulse 70   Temp 98.1 F (36.7 C) (Oral)   Resp 18   Ht 6' 1 (1.854 m)   Wt 199 lb (90.3 kg)   SpO2 98%   BMI 26.25 kg/m   Resistant hypertension Assessment & Plan: Patient continues with valsartan , amlodipine  and hydralazine .  He notes that he is out of doxazosin  currently.  He still has not arranged for follow-up appointment with cardiology despite extensive discussion regarding this during last appointment.  He primarily cites cost of this reason, indicating that co-pay to meet with cardiologist is a lot more than co-pay for our appointments. Blood pressure continues to be above goal.  Medications refilled today.  Again discussed importance of scheduling follow-up appointment with cardiologist.  I can appreciate reluctance to have to spend the money to meet with specialist, but blood pressure continues to be suboptimally controlled and at prior visit with cardiologist, there was discussion for exploring other interventions such as renal denervation.  This would ultimately be something that would need to be discussed further with cardiologist. He previously was reluctant to utilize hydrochlorothiazide due to working outside and having to use restroom more often.  He is now working inside and wonders about starting this medication.  Given elevated blood pressure, would be reasonable to add additional agent.  We can proceed with starting low-dose of hydrochlorothiazide.  He will return for follow-up BMP in about 2 weeks. Recommend intermittent monitoring of blood pressure at home, DASH diet. Will plan to follow-up in 1 month to assess progress with medication.  Orders: -     Valsartan ; Take 1 tablet (320 mg total) by  mouth daily. Call Cardiology for appointment.  Dispense: 90 tablet; Refill: 1 -     Basic metabolic panel with GFR  Attention deficit hyperactivity disorder (ADHD), unspecified ADHD type Assessment & Plan: Patient notes history of ADHD.  Has not had medication recently.  Medication options have been limited due to poorly controlled blood pressure.  He notes that he has tried Wellbutrin in the past but had notable side effects from medication, thinks these were predominantly sexual side effects. Long discussion today regarding management of symptoms, risk associated with certain medication classes.  Despite this, patient is wanting to begin medication even though it can have negative impact on blood pressure.  We discussed this potential impact, notably related to further elevation of blood pressure and risk that can come along with this including acute cardiac, neurologic, etc. organ system involvement with very elevated blood pressure.  We discussed in rare cases this could even lead to premature death or long-term complications. After discussion, patient elected to proceed with Strattera.  Will start with low-dose medication and assess response in about a month.  He will monitor blood pressure closely at home.  He also will schedule follow-up appoint with cardiology to assist with better controlling blood pressure.   Other orders -     Doxazosin  Mesylate; Take 1 tablet (4 mg total) by mouth daily.  Dispense: 90 tablet; Refill: 0 -     hydrALAZINE  HCl; Take 1 tablet (100 mg total) by mouth 3 (three) times daily.  Dispense: 45 tablet; Refill: 0 -     amLODIPine  Besylate; Take 1  tablet (10 mg total) by mouth daily.  Dispense: 15 tablet; Refill: 0 -     Atomoxetine HCl; Take 1 capsule (40 mg total) by mouth daily.  Dispense: 30 capsule; Refill: 0 -     hydroCHLOROthiazide; Take 1 tablet (12.5 mg total) by mouth daily.  Dispense: 90 tablet; Refill: 0  Return in about 4 weeks (around 09/04/2024) for  hypertension, med check.   ___________________________________________ Jeana Kersting de Cuba, MD, ABFM, CAQSM Primary Care and Sports Medicine [facility] "

## 2024-08-07 NOTE — Assessment & Plan Note (Signed)
 Patient notes history of ADHD.  Has not had medication recently.  Medication options have been limited due to poorly controlled blood pressure.  He notes that he has tried Wellbutrin in the past but had notable side effects from medication, thinks these were predominantly sexual side effects. Long discussion today regarding management of symptoms, risk associated with certain medication classes.  Despite this, patient is wanting to begin medication even though it can have negative impact on blood pressure.  We discussed this potential impact, notably related to further elevation of blood pressure and risk that can come along with this including acute cardiac, neurologic, etc. organ system involvement with very elevated blood pressure.  We discussed in rare cases this could even lead to premature death or long-term complications. After discussion, patient elected to proceed with Strattera.  Will start with low-dose medication and assess response in about a month.  He will monitor blood pressure closely at home.  He also will schedule follow-up appoint with cardiology to assist with better controlling blood pressure.

## 2024-08-22 ENCOUNTER — Ambulatory Visit (HOSPITAL_BASED_OUTPATIENT_CLINIC_OR_DEPARTMENT_OTHER)

## 2024-08-23 LAB — BASIC METABOLIC PANEL WITH GFR
BUN/Creatinine Ratio: 16 (ref 10–24)
BUN: 21 mg/dL (ref 8–27)
CO2: 26 mmol/L (ref 20–29)
Calcium: 9.9 mg/dL (ref 8.6–10.2)
Chloride: 94 mmol/L — ABNORMAL LOW (ref 96–106)
Creatinine, Ser: 1.35 mg/dL — ABNORMAL HIGH (ref 0.76–1.27)
Glucose: 107 mg/dL — ABNORMAL HIGH (ref 70–99)
Potassium: 3.9 mmol/L (ref 3.5–5.2)
Sodium: 137 mmol/L (ref 134–144)
eGFR: 60 mL/min/1.73

## 2024-09-04 ENCOUNTER — Other Ambulatory Visit (HOSPITAL_BASED_OUTPATIENT_CLINIC_OR_DEPARTMENT_OTHER): Payer: Self-pay

## 2024-09-04 ENCOUNTER — Encounter (HOSPITAL_BASED_OUTPATIENT_CLINIC_OR_DEPARTMENT_OTHER): Payer: Self-pay | Admitting: Family Medicine

## 2024-09-04 ENCOUNTER — Ambulatory Visit (HOSPITAL_BASED_OUTPATIENT_CLINIC_OR_DEPARTMENT_OTHER): Admitting: Family Medicine

## 2024-09-04 VITALS — BP 149/83 | HR 93 | Temp 97.8°F | Resp 20 | Ht 73.0 in | Wt 200.0 lb

## 2024-09-04 DIAGNOSIS — F909 Attention-deficit hyperactivity disorder, unspecified type: Secondary | ICD-10-CM

## 2024-09-04 DIAGNOSIS — I1A0 Resistant hypertension: Secondary | ICD-10-CM

## 2024-09-04 LAB — BASIC METABOLIC PANEL WITH GFR
BUN/Creatinine Ratio: 16 (ref 10–24)
BUN: 19 mg/dL (ref 8–27)
CO2: 27 mmol/L (ref 20–29)
Calcium: 10.7 mg/dL — ABNORMAL HIGH (ref 8.6–10.2)
Chloride: 94 mmol/L — ABNORMAL LOW (ref 96–106)
Creatinine, Ser: 1.16 mg/dL (ref 0.76–1.27)
Glucose: 107 mg/dL — ABNORMAL HIGH (ref 70–99)
Potassium: 3.7 mmol/L (ref 3.5–5.2)
Sodium: 137 mmol/L (ref 134–144)
eGFR: 72 mL/min/{1.73_m2}

## 2024-09-04 MED ORDER — ATOMOXETINE HCL 40 MG PO CAPS
40.0000 mg | ORAL_CAPSULE | Freq: Every day | ORAL | 0 refills | Status: AC
  Filled 2024-09-04: qty 30, 30d supply, fill #0

## 2024-09-04 NOTE — Assessment & Plan Note (Signed)
 Has been taking atomoxetine , denies any issues with medication.  Feels that he had some improvement initially with symptoms with medication, however more recently uncertain of benefit that medication is providing.  Given concerns with impact of medication on blood pressure, would be reluctant to make significant dose changes for now.  Recommend continue with current dose of medication and can consider adjusting future pending progress with blood pressure

## 2024-09-04 NOTE — Assessment & Plan Note (Signed)
 Patient continues with valsartan , amlodipine  and hydralazine .  He has not arranged for follow-up appointment with cardiology - extensive discussion regarding this during last appointment; he plans to schedule appointment, he has phone number to call them.  He primarily cites cost of this reason, indicating that co-pay to meet with cardiologist is a lot more than co-pay for our appointments. Blood pressure continues to be above goal.  Again discussed importance of scheduling follow-up appointment with cardiologist.  I can appreciate reluctance to have to spend the money to meet with specialist, but blood pressure continues to be suboptimally controlled and at prior visit with cardiologist, there was discussion for exploring other interventions such as renal denervation.  This would ultimately be something that would need to be discussed further with cardiologist. We will check repeat BMP today due to mild elevation in creatinine. Recommend intermittent monitoring of blood pressure at home, DASH diet. Will plan to follow-up in 2 months to assess progress with medication.

## 2024-09-04 NOTE — Progress Notes (Signed)
" ° ° °  Procedures performed today:    None.  Independent interpretation of notes and tests performed by another provider:   None.  Brief History, Exam, Impression, and Recommendations:    BP (!) 171/99 (BP Location: Left Arm, Patient Position: Sitting, Cuff Size: Normal)   Pulse (!) 103   Temp 97.8 F (36.6 C) (Oral)   Resp 20   Ht 6' 1 (1.854 m)   Wt 200 lb (90.7 kg)   SpO2 98%   BMI 26.39 kg/m   Resistant hypertension Assessment & Plan: Patient continues with valsartan , amlodipine  and hydralazine .  He has not arranged for follow-up appointment with cardiology - extensive discussion regarding this during last appointment; he plans to schedule appointment, he has phone number to call them.  He primarily cites cost of this reason, indicating that co-pay to meet with cardiologist is a lot more than co-pay for our appointments. Blood pressure continues to be above goal.  Again discussed importance of scheduling follow-up appointment with cardiologist.  I can appreciate reluctance to have to spend the money to meet with specialist, but blood pressure continues to be suboptimally controlled and at prior visit with cardiologist, there was discussion for exploring other interventions such as renal denervation.  This would ultimately be something that would need to be discussed further with cardiologist. We will check repeat BMP today due to mild elevation in creatinine. Recommend intermittent monitoring of blood pressure at home, DASH diet. Will plan to follow-up in 2 months to assess progress with medication.  Orders: -     Basic metabolic panel with GFR  Attention deficit hyperactivity disorder (ADHD), unspecified ADHD type Assessment & Plan: Has been taking atomoxetine , denies any issues with medication.  Feels that he had some improvement initially with symptoms with medication, however more recently uncertain of benefit that medication is providing.  Given concerns with impact of  medication on blood pressure, would be reluctant to make significant dose changes for now.  Recommend continue with current dose of medication and can consider adjusting future pending progress with blood pressure   Other orders -     Atomoxetine  HCl; Take 1 capsule (40 mg total) by mouth daily.  Dispense: 30 capsule; Refill: 0  Return in about 2 months (around 11/02/2024) for hypertension, med check.   ___________________________________________ Darcel Zick de Cuba, MD, ABFM, CAQSM Primary Care and Sports Medicine [facility] "

## 2024-09-05 ENCOUNTER — Ambulatory Visit (HOSPITAL_BASED_OUTPATIENT_CLINIC_OR_DEPARTMENT_OTHER): Payer: Self-pay | Admitting: Family Medicine

## 2024-11-05 ENCOUNTER — Ambulatory Visit (HOSPITAL_BASED_OUTPATIENT_CLINIC_OR_DEPARTMENT_OTHER): Admitting: Family Medicine
